# Patient Record
Sex: Female | Born: 1975 | Race: Black or African American | Hispanic: No | Marital: Single | Smoking: Never smoker
Health system: Southern US, Community
[De-identification: ages and names within clinical notes are randomized; demographics above are authoritative.]

## PROBLEM LIST (undated history)

## (undated) HISTORY — PX: LAPAROSCOPIC GASTRIC SLEEVE RESECTION: SHX5895

---

## 2014-07-29 ENCOUNTER — Ambulatory Visit (INDEPENDENT_AMBULATORY_CARE_PROVIDER_SITE_OTHER): Payer: BC Managed Care – PPO | Admitting: Family Medicine

## 2014-07-29 VITALS — BP 120/86 | HR 82 | Temp 97.4°F | Resp 16 | Ht 68.0 in | Wt 187.5 lb

## 2014-07-29 DIAGNOSIS — T23001A Burn of unspecified degree of right hand, unspecified site, initial encounter: Secondary | ICD-10-CM

## 2014-07-29 DIAGNOSIS — IMO0002 Reserved for concepts with insufficient information to code with codable children: Secondary | ICD-10-CM

## 2014-07-29 DIAGNOSIS — Z23 Encounter for immunization: Secondary | ICD-10-CM

## 2014-07-29 MED ORDER — SILVER SULFADIAZINE 1 % EX CREA: 1.0000 "application " | TOPICAL_CREAM | Freq: Every day | CUTANEOUS | Status: DC

## 2014-07-29 NOTE — Progress Notes (Signed)
This chart was scribed for Jillian Sidle, MD by Luisa Dago, ED Scribe. This patient was seen in room 9 and the patient's care was started at 8:16 PM.  Patient ID: Jillian [name] MRN: [id number], DOB: 02/18/1976, 39 y.o. Date of Encounter: 07/29/2014, 8:14 PM  Primary Physician: No PCP Per Patient  Chief Complaint:  Chief Complaint  Patient presents with   Burn    Grease Burn on Right Hand/Arm x 1 week ago    HPI: 39 y.o. 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 Pt states that she was cooking hamburgers when the grease burned her right hand.She is complaining of associated burning and "throbbing" pain. Last date of tetanus vaccine unknown. Pt denies fever, neck pain, sore throat, visual disturbance, CP, cough, SOB, abdominal pain, nausea, emesis, diarrhea, urinary symptoms, back pain, HA, weakness, numbness and rash as associated symptoms.     History reviewed. No pertinent past medical history.   Home Meds: Prior to Admission medications   Not on File    Allergies: No Known Allergies  History   Social History   Marital Status: Single    Spouse Name: N/A   Number of Children: N/A   Years of Education: N/A   Occupational History   Not on file.   Social History Main Topics   Smoking status: Never Smoker    Smokeless tobacco: Never Used   Alcohol Use: No   Drug Use: No   Sexual Activity: Not on file   Other Topics Concern   Not on file   Social History Narrative   No narrative on file     Review of Systems: positive burn to right hand Constitutional: negative for chills, fever, night sweats, weight changes, or fatigue  HEENT: negative for vision changes, hearing loss, congestion, rhinorrhea, ST, epistaxis, or sinus pressure Cardiovascular: negative for chest pain or palpitations Respiratory: negative for  hemoptysis, wheezing, shortness of breath, or cough Abdominal: negative for abdominal pain, nausea, vomiting, diarrhea, or constipation Dermatological: negative for rash Neurologic: negative for headache, dizziness, or syncope All other systems reviewed and are otherwise negative with the exception to those above and in the HPI.   Physical Exam:  Blood pressure 120/86, pulse 82, temperature 97.4 F (36.3 C), temperature source Oral, resp. rate 16, height  (1.727 m), weight 187 lb 8 oz (85.049 kg), SpO2 100 %., Body mass index is 28.52 kg/(m^2). General: Well developed, well nourished, in no acute distress. Head: Normocephalic, atraumatic, eyes without discharge, sclera non-icteric, nares are without discharge. Bilateral auditory canals clear, TM's are without perforation, pearly grey and translucent with reflective cone of light bilaterally. Oral cavity moist, posterior pharynx without exudate, erythema, peritonsillar abscess, or post nasal drip.  Neck: Supple. No thyromegaly. Full ROM. No lymphadenopathy. Lungs: Clear bilaterally to auscultation without wheezes, rales, or rhonchi. Breathing is unlabored. Heart: RRR with S1 S2. No murmurs, rubs, or gallops appreciated. Abdomen: Soft, non-tender, non-distended with normoactive bowel sounds. No hepatomegaly. No rebound/guarding. No obvious abdominal masses. Msk:  Strength and tone normal for age. Extremities/Skin: Warm and dry. No clubbing or cyanosis. No edema. No rashes.Several burns to dorsal right forearm and hand with good re-epythiliazation Neuro: Alert and oriented X 3. Moves all extremities spontaneously. Gait is normal. CNII-XII grossly in tact. Psych:  Responds to questions appropriately with a normal affect.  ASSESSMENT AND PLAN:  39 y.o. year old female with  This chart was scribed in my presence and reviewed by me personally.    ICD-9-CM ICD-10-CM   1. Burn, second degree 949.2 T30.0 silver sulfADIAZINE (SILVADENE) 1 %  cream     Td vaccine greater than or equal to 7yo preservative free IM     CANCELED: DT Vaccine greater than 7yo IM     Signed, Jillian SidleKurt Lauenstein, MD    Signed, Jillian SidleKurt Lauenstein, MD 07/29/2014 8:14 PM

## 2014-07-29 NOTE — Patient Instructions (Addendum)
Burn Care Your skin is a natural barrier to infection. It is the largest organ of your body. Burns damage this natural protection. To help prevent infection, it is very important to follow your caregiver's instructions in the care of your burn. Burns are classified as:  First degree. There is only redness of the skin (erythema). No scarring is expected.  Second degree. There is blistering of the skin. Scarring may occur with deeper burns.  Third degree. All layers of the skin are injured, and scarring is expected. HOME CARE INSTRUCTIONS   Wash your hands well before changing your bandage.  Change your bandage as often as directed by your caregiver.  Remove the old bandage. If the bandage sticks, you may soak it off with cool, clean water.  Cleanse the burn thoroughly but gently with mild soap and water.  Pat the area dry with a clean, dry cloth.  Apply a thin layer of antibacterial cream to the burn.  Apply a clean bandage as instructed by your caregiver.  Keep the bandage as clean and dry as possible.  Elevate the affected area for the first 24 hours, then as instructed by your caregiver.  Only take over-the-counter or prescription medicines for pain, discomfort, or fever as directed by your caregiver. SEEK IMMEDIATE MEDICAL CARE IF:   You develop excessive pain.  You develop redness, tenderness, swelling, or red streaks near the burn.  The burned area develops yellowish-white fluid (pus) or a bad smell.  You have a fever. MAKE SURE YOU:   Understand these instructions.  Will watch your condition.  Will get help right away if you are not doing well or get worse. Document Released: 05/17/2005 Document Revised: 08/09/2011 Document Reviewed: 10/07/2010 [facility] Patient Information 2015 [place], [place]. This information is not intended to replace advice given to you by your health care provider. Make sure you discuss any questions you have with your health care  provider. Diphtheria Toxoid; Tetanus Toxoid Adsorbed, DT, Td What is this medicine? DIPHTHERIA AND TETANUS TOXOIDS ADSORBED (dif THEER ee uh and TET n [place] TOK soids ad SAWRB) is a vaccine. It is used to prevent infections of diphtheria and tetanus (lockjaw). This medicine may be used for other purposes; ask your health care provider or pharmacist if you have questions. COMMON BRAND NAME(S): DECAVAC, TENIVAC What should I tell my health care provider before I take this medicine? They need to know if you have any of these conditions: -bleeding disorder -immune system problems -infection with fever -low levels of platelets in the blood -an unusual or allergic reaction to diphtheria or tetanus toxoid, latex, thimerosal, other medicines, foods, dyes, or preservatives -pregnant or trying to get pregnant -breast-feeding How should I use this medicine? This vaccine is for injection into a muscle. It is given by a health care professional. A copy of Vaccine Information Statements will be given before each vaccination. Read this sheet carefully each time. The sheet may change frequently. Talk to your pediatrician regarding the use of this medicine in children. While this drug may be prescribed for selected conditions, precautions do apply. Overdosage: If you think you have taken too much of this medicine contact a poison control center or emergency room at once. NOTE: This medicine is only for you. Do not share this medicine with others. What if I miss a dose? Keep appointments for follow-up (booster) doses as directed. It is important not to miss your dose. Call your doctor or health care professional if you are unable to  keep an appointment. What may interact with this medicine? -adalimumab -anakinra -infliximab -live vaccines -medicines that suppress your immune system -medicines to treat cancer -medicines that treat or prevent blood clots like daily aspirin, enoxaparin, heparin, ticlopidine,  warfarin -radiopharmaceuticals like iodine I-125 or I-131 This list may not describe all possible interactions. Give your health care provider a list of all the medicines, herbs, non-prescription drugs, or dietary supplements you use. Also tell them if you smoke, drink alcohol, or use illegal drugs. Some items may interact with your medicine. What should I watch for while using this medicine? Contact your doctor or health care professional and seek emergency medical care if any serious side effects occur. This vaccine, like all vaccines, may not fully protect everyone. What side effects may I notice from receiving this medicine? Side effects that you should report to your doctor or health care professional as soon as possible: -allergic reactions like skin rash, itching or hives, swelling of the face, lips, or tongue -arthritis pain -breathing problems -changes in hearing -extreme changes in behavior -fast, irregular heartbeat -fever over 100 degrees F -pain, tingling, numbness in the hands or feet -seizures -unusually weak or tired Side effects that usually do not require medical attention (report to your doctor or health care professional if they continue or are bothersome): -aches or pains -bruising, pain, swelling at site where injected -headache -loss of appetite -low-grade fever of 100 degrees F or less -nausea, vomiting -sleepy -swollen glands This list may not describe all possible side effects. Call your doctor for medical advice about side effects. You may report side effects to FDA at 1-800-FDA-1088. Where should I keep my medicine? This drug is given in a hospital or clinic and will not be stored at home. NOTE: This sheet is a summary. It may not cover all possible information. If you have questions about this medicine, talk to your doctor, pharmacist, or health care provider.  2015, Elsevier/Gold Standard. (2007-09-14 13:57:36)

## 2015-01-02 ENCOUNTER — Ambulatory Visit (INDEPENDENT_AMBULATORY_CARE_PROVIDER_SITE_OTHER): Payer: BC Managed Care – PPO | Admitting: Family Medicine

## 2015-01-02 VITALS — BP 126/82 | HR 79 | Temp 97.9°F | Resp 20 | Ht 68.0 in | Wt 185.1 lb

## 2015-01-02 DIAGNOSIS — R110 Nausea: Secondary | ICD-10-CM | POA: Diagnosis not present

## 2015-01-02 DIAGNOSIS — F411 Generalized anxiety disorder: Secondary | ICD-10-CM | POA: Diagnosis not present

## 2015-01-02 DIAGNOSIS — R197 Diarrhea, unspecified: Secondary | ICD-10-CM

## 2015-01-02 DIAGNOSIS — K219 Gastro-esophageal reflux disease without esophagitis: Secondary | ICD-10-CM | POA: Diagnosis not present

## 2015-01-02 DIAGNOSIS — K522 Allergic and dietetic gastroenteritis and colitis: Secondary | ICD-10-CM | POA: Diagnosis not present

## 2015-01-02 DIAGNOSIS — K5229 Other allergic and dietetic gastroenteritis and colitis: Secondary | ICD-10-CM

## 2015-01-02 LAB — POCT CBC
Granulocyte percent: 39.6 %G (ref 37–80)
HCT, POC: 45.5 % (ref 37.7–47.9)
Hemoglobin: 14.7 g/dL (ref 12.2–16.2)
Lymph, poc: 4 — AB (ref 0.6–3.4)
MCH, POC: 28.3 pg (ref 27–31.2)
MCHC: 32.3 g/dL (ref 31.8–35.4)
MCV: 87.6 fL (ref 80–97)
MID (cbc): 0.4 (ref 0–0.9)
MPV: 8.2 fL (ref 0–99.8)
POC Granulocyte: 2.9 (ref 2–6.9)
POC LYMPH PERCENT: 54.4 % — AB (ref 10–50)
POC MID %: 6 % (ref 0–12)
Platelet Count, POC: 272 10*3/uL (ref 142–424)
RBC: 5.19 M/uL (ref 4.04–5.48)
RDW, POC: 13.6 %
WBC: 7.4 10*3/uL (ref 4.6–10.2)

## 2015-01-02 LAB — GLUCOSE, POCT (MANUAL RESULT ENTRY): POC Glucose: 91 mg/dl (ref 70–99)

## 2015-01-02 MED ORDER — ONDANSETRON 4 MG PO TBDP: 4.0000 mg | ORAL_TABLET | Freq: Three times a day (TID) | ORAL | Status: DC | PRN

## 2015-01-02 MED ORDER — ONDANSETRON 4 MG PO TBDP
4.0000 mg | ORAL_TABLET | Freq: Once | ORAL | Status: AC
  Administered 2015-01-02: 4 mg via ORAL

## 2015-01-02 MED ORDER — CLONAZEPAM 0.5 MG PO TABS: ORAL_TABLET | ORAL | Status: DC

## 2015-01-02 MED ORDER — PANTOPRAZOLE SODIUM 40 MG PO TBEC: 40.0000 mg | DELAYED_RELEASE_TABLET | Freq: Every day | ORAL | Status: DC

## 2015-01-02 MED ORDER — ESCITALOPRAM OXALATE 10 MG PO TABS: 10.0000 mg | ORAL_TABLET | Freq: Every day | ORAL | Status: DC

## 2015-01-02 NOTE — Progress Notes (Signed)
Chief Complaint:  Chief Complaint  Patient presents with  . Abdominal Pain    stomach virus that started on sunday--feeling queasy and nausea.  Marland Kitchen Anxiety    feels like her anxiety has been getting worse x 2 wks  . Gastrophageal Reflux    reflux daily since she had the gastric surgery    HPI: Jillian [name] is a 39 y.o. female who reports to [facility] today complaining of feels sick on her stomach since Sunday, feel some throat irritation on Sunday after she ate watermelon, she denies any SOB or CP.  She had nonbloody diarrhea. She feesl subjectively warm and had chills. She had nausea . She has acid reflux daily and she was taking zantac without relief. She still has it every day, this started 4 days ago , she had diarrhea Sunday and Monday but not anymore. 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 No family hx of inflammaotry disease, IBD, crohns, UC. Her dausghter was slightly nauseated after eating the watermelon, Her neighbor had a stomach virus. Has not eaten much because felt sick. She does nto usually have these sxs with her cycle.   She has had anxiety for most of her life, mom and sister take celexa and lexapro . SHe has tried wellbutrin without releif in the past. She has anxiety about everything. She is currently on her menstrual cycle.Denies any SI/Hi/hallucinations. She is very stressed at work and everything makes her anxious, decrease sleep due to this. Then it makes her reflux worse.   History reviewed. No pertinent past medical history. Past Surgical History  Procedure Laterality Date  . Cesarean section    . Laparoscopic gastric sleeve resection     History   Social History  . Marital Status: Single    Spouse Name: N/A  . Number of Children: N/A  . Years of Education: N/A   Social History Main Topics  . Smoking status: Never Smoker   . Smokeless tobacco: Never Used  . Alcohol Use: No  . Drug Use: No  . Sexual Activity: Not on file    Other Topics Concern  . None   Social History Narrative   Family History  Problem Relation Age of Onset  . Diabetes Mother   . Hyperlipidemia Mother   . Hypertension Mother   . Hyperlipidemia Sister   . Diabetes Maternal Grandfather   . Heart disease Paternal Grandmother    No Known Allergies Prior to Admission medications   Medication Sig Start Date End Date Taking? Authorizing Provider  silver sulfADIAZINE (SILVADENE) 1 % cream Apply 1 application topically daily. Patient not taking: Reported on 01/02/2015 07/29/14   Elvina Sidle, MD     ROS: The patient denies night sweats, unintentional weight loss, chest pain, palpitations, wheezing, dyspnea on exertion,  vomiting, dysuria, hematuria, melena, numbness, weakness, or tingling.   All other systems have been reviewed and were otherwise negative with the exception of those mentioned in the HPI and as above.    PHYSICAL EXAM: Filed Vitals:   01/02/15 1906  BP: 126/82  Pulse: 79  Temp: 97.9 F (36.6 C)  Resp: 20   Body mass index is 28.15 kg/(m^2).   General: Alert, no acute distress, anxious HEENT:  Normocephalic, atraumatic, oropharynx patent. EOMI, PERRLA,  TM normal, oral mucosa dry Cardiovascular:  Regular rate and rhythm, no rubs murmurs or gallops.  No Carotid bruits, radial pulse intact. No pedal edema.  Respiratory: Clear to auscultation bilaterally.  No wheezes, rales, or rhonchi.  No cyanosis, no use of accessory musculature Abdominal: No organomegaly, abdomen is soft and non-tender, positive bowel sounds. No masses. Skin: No rashes. Neurologic: Facial musculature symmetric. Psychiatric: Patient acts appropriately throughout our interaction. Lymphatic: No cervical or submandibular lymphadenopathy Musculoskeletal: Gait intact. No edema, tenderness   LABS: Results for orders placed or performed in visit on 01/02/15  POCT CBC  Result Value Ref Range   WBC 7.4 4.6 - 10.2 K/uL   Lymph, poc 4.0 (A) 0.6 -  3.4   POC LYMPH PERCENT 54.4 (A) 10 - 50 %L   MID (cbc) 0.4 0 - 0.9   POC MID % 6.0 0 - 12 %M   POC Granulocyte 2.9 2 - 6.9   Granulocyte percent 39.6 37 - 80 %G   RBC 5.19 4.04 - 5.48 M/uL   Hemoglobin 14.7 12.2 - 16.2 g/dL   HCT, POC 16.1 09.6 - 47.9 %   MCV 87.6 80 - 97 fL   MCH, POC 28.3 27 - 31.2 pg   MCHC 32.3 31.8 - 35.4 g/dL   RDW, POC 04.5 %   Platelet Count, POC 272 142 - 424 K/uL   MPV 8.2 0 - 99.8 fL  POCT glucose (manual entry)  Result Value Ref Range   POC Glucose 91 70 - 99 mg/dl     EKG/XRAY:   Primary read interpreted by Dr. Conley Rolls at [facility]   ASSESSMENT/PLAN: Encounter Diagnoses  Name Primary?  . Dietetic gastroenteritis Yes  . Nausea without vomiting   . Generalized anxiety disorder   . Diarrhea   . Gastroesophageal reflux disease, esophagitis presence not specified    IVF x 1, felt better Rx Zofran Rx Klonopin prn, this is too bridge until the Lexapro becomes effective at 4-6 weeks RX Lexapro, protonix BRAT diet Fu in 6-8 weeks  Gross sideeffects, risk and benefits, and alternatives of medications d/w patient. Patient is aware that all medications have potential sideeffects and we are unable to predict every sideeffect or drug-drug interaction that may occur.  Jillian Hobbs DO  01/02/2015 8:51 PM

## 2015-01-06 DIAGNOSIS — F411 Generalized anxiety disorder: Secondary | ICD-10-CM | POA: Insufficient documentation

## 2015-01-06 DIAGNOSIS — K219 Gastro-esophageal reflux disease without esophagitis: Secondary | ICD-10-CM | POA: Insufficient documentation

## 2015-02-06 ENCOUNTER — Other Ambulatory Visit (INDEPENDENT_AMBULATORY_CARE_PROVIDER_SITE_OTHER): Payer: Self-pay | Admitting: Physician Assistant

## 2015-02-06 DIAGNOSIS — R1013 Epigastric pain: Secondary | ICD-10-CM

## 2015-02-07 ENCOUNTER — Ambulatory Visit
Admission: RE | Admit: 2015-02-07 | Discharge: 2015-02-07 | Disposition: A | Discharge status: Home / Self Care | Payer: BC Managed Care – PPO | Source: Ambulatory Visit | Attending: Physician Assistant | Admitting: Physician Assistant

## 2015-02-07 DIAGNOSIS — R1013 Epigastric pain: Secondary | ICD-10-CM

## 2015-02-17 ENCOUNTER — Ambulatory Visit (INDEPENDENT_AMBULATORY_CARE_PROVIDER_SITE_OTHER): Payer: BC Managed Care – PPO | Admitting: Family Medicine

## 2015-02-17 VITALS — BP 144/90 | HR 87 | Temp 98.1°F | Resp 16 | Ht 68.0 in | Wt 182.4 lb

## 2015-02-17 DIAGNOSIS — J069 Acute upper respiratory infection, unspecified: Secondary | ICD-10-CM

## 2015-02-17 DIAGNOSIS — Z113 Encounter for screening for infections with a predominantly sexual mode of transmission: Secondary | ICD-10-CM | POA: Diagnosis not present

## 2015-02-17 NOTE — Patient Instructions (Signed)
It appears that you have a cold or allergies Use OTC medications as needed and let me know if you are not better in the next few days- Sooner if worse.   I will be in touch with your labs asap- if you sign up for mychart I can send them to you as soon as they come in Take care!

## 2015-02-17 NOTE — Progress Notes (Signed)
[facility] [address], [place] [address] [phone]  Date:  02/17/2015   Name:  Jillian [name]   DOB:  1976-03-02   MRN:  [id number]  PCP:  No PCP Per Patient    Chief Complaint: Exposure to STD   History of Present Illness:  Jillian [name] is a 39 y.o. very pleasant female patient who presents with the following:  She has noted a scratchy throat and headache for a couple of days.  No fever Thinks it is a cold but thought she would mention it.   She is generally in good health  She has not had any STI sx or known exposures.  She was just worried and wanted to be tested today  She has not urinanated in the last hour.    Patient Active Problem List   Diagnosis Date Noted  . Generalized anxiety disorder 01/06/2015  . Esophageal reflux 01/06/2015    No past medical history on file.  Past Surgical History  Procedure Laterality Date  . Cesarean section    . Laparoscopic gastric sleeve resection      Social History  Substance Use Topics  . Smoking status: Never Smoker   . Smokeless tobacco: Never Used  . Alcohol Use: No    Family History  Problem Relation Age of Onset  . Diabetes Mother   . Hyperlipidemia Mother   . Hypertension Mother   . Hyperlipidemia Sister   . Diabetes Maternal Grandfather   . Heart disease Paternal Grandmother     No Known Allergies  Medication list has been reviewed and updated.  Current Outpatient Prescriptions on File Prior to Visit  Medication Sig Dispense Refill  . escitalopram (LEXAPRO) 10 MG tablet Take 1 tablet (10 mg total) by mouth daily. 30 tablet 1  . pantoprazole (PROTONIX) 40 MG tablet Take 1 tablet (40 mg total) by mouth daily. 30 tablet 3   No current facility-administered medications on file prior to visit.    Review of Systems:  As per HPI- otherwise negative.   Physical Examination: Filed Vitals:   02/17/15 1537  BP: 144/90  Pulse: 87  Temp: 98.1 F (36.7 C)  Resp: 16    Filed Vitals:   02/17/15 1537  Height:  (1.727 m)  Weight: 182 lb 6.4 oz (82.736 kg)   Body mass index is 27.74 kg/(m^2). Ideal Body Weight: Weight in (lb) to have BMI = 25: 164.1  GEN: WDWN, NAD, Non-toxic, A & O x 3, looks well HEENT: Atraumatic, Normocephalic. Neck supple. No masses, No LAD.  Bilateral TM wnl, oropharynx normal.  PEERL,EOMI.   Ears and Nose: No external deformity. CV: RRR, No M/G/R. No JVD. No thrill. No extra heart sounds. PULM: CTA B, no wheezes, crackles, rhonchi. No retractions. No resp. distress. No accessory muscle use. EXTR: No c/c/e NEURO Normal gait.  PSYCH: Normally interactive. Conversant. Not depressed or anxious appearing.  Calm demeanor.    Assessment and Plan: Screening for STD (sexually transmitted disease) - Plan: GC/Chlamydia Probe Amp, HIV antibody, Hepatitis B surface antibody, Hepatitis B surface antigen, Hepatitis C antibody, RPR  Viral URI  Reassured that she seems to have a viral URI- let me know if not better soon STI testing today  Signed Abbe Amsterdam, MD

## 2015-02-18 LAB — HEPATITIS C ANTIBODY: HCV AB: NEGATIVE

## 2015-02-18 LAB — GC/CHLAMYDIA PROBE AMP
CT Probe RNA: NEGATIVE
GC Probe RNA: NEGATIVE

## 2015-02-18 LAB — HEPATITIS B SURFACE ANTIBODY, QUANTITATIVE: Hepatitis B-Post: 0 m[IU]/mL

## 2015-02-18 LAB — HIV ANTIBODY (ROUTINE TESTING W REFLEX): HIV 1&2 Ab, 4th Generation: NONREACTIVE

## 2015-02-18 LAB — HEPATITIS B SURFACE ANTIGEN: Hepatitis B Surface Ag: NEGATIVE

## 2015-02-18 LAB — RPR

## 2015-03-23 ENCOUNTER — Emergency Department (HOSPITAL_COMMUNITY)
Admission: EM | Admit: 2015-03-23 | Discharge: 2015-03-23 | Disposition: A | Discharge status: Home / Self Care | Payer: BC Managed Care – PPO | Attending: Emergency Medicine | Admitting: Emergency Medicine

## 2015-03-23 ENCOUNTER — Encounter (HOSPITAL_COMMUNITY): Payer: Self-pay | Admitting: Oncology

## 2015-03-23 ENCOUNTER — Emergency Department (HOSPITAL_COMMUNITY): Payer: BC Managed Care – PPO

## 2015-03-23 DIAGNOSIS — W19XXXA Unspecified fall, initial encounter: Secondary | ICD-10-CM

## 2015-03-23 DIAGNOSIS — Y9389 Activity, other specified: Secondary | ICD-10-CM | POA: Diagnosis not present

## 2015-03-23 DIAGNOSIS — W01198A Fall on same level from slipping, tripping and stumbling with subsequent striking against other object, initial encounter: Secondary | ICD-10-CM | POA: Diagnosis not present

## 2015-03-23 DIAGNOSIS — S29009A Unspecified injury of muscle and tendon of unspecified wall of thorax, initial encounter: Secondary | ICD-10-CM | POA: Diagnosis not present

## 2015-03-23 DIAGNOSIS — Y998 Other external cause status: Secondary | ICD-10-CM | POA: Insufficient documentation

## 2015-03-23 DIAGNOSIS — Y9289 Other specified places as the place of occurrence of the external cause: Secondary | ICD-10-CM | POA: Insufficient documentation

## 2015-03-23 DIAGNOSIS — Z79899 Other long term (current) drug therapy: Secondary | ICD-10-CM | POA: Diagnosis not present

## 2015-03-23 DIAGNOSIS — M542 Cervicalgia: Secondary | ICD-10-CM

## 2015-03-23 DIAGNOSIS — S199XXA Unspecified injury of neck, initial encounter: Secondary | ICD-10-CM | POA: Insufficient documentation

## 2015-03-23 MED ORDER — ONDANSETRON 8 MG PO TBDP
8.0000 mg | ORAL_TABLET | Freq: Once | ORAL | Status: AC
  Administered 2015-03-23: 8 mg via ORAL
  Filled 2015-03-23: qty 1

## 2015-03-23 MED ORDER — NAPROXEN 500 MG PO TABS: 500.0000 mg | ORAL_TABLET | Freq: Two times a day (BID) | ORAL | Status: DC

## 2015-03-23 MED ORDER — KETOROLAC TROMETHAMINE 60 MG/2ML IM SOLN
60.0000 mg | Freq: Once | INTRAMUSCULAR | Status: AC
  Administered 2015-03-23: 60 mg via INTRAMUSCULAR
  Filled 2015-03-23: qty 2

## 2015-03-23 MED ORDER — METHOCARBAMOL 500 MG PO TABS: 500.0000 mg | ORAL_TABLET | Freq: Two times a day (BID) | ORAL | Status: DC

## 2015-03-23 MED ORDER — OXYCODONE-ACETAMINOPHEN 5-325 MG PO TABS: 1.0000 | ORAL_TABLET | Freq: Four times a day (QID) | ORAL | Status: DC | PRN

## 2015-03-23 MED ORDER — HYDROMORPHONE HCL 1 MG/ML IJ SOLN
1.0000 mg | Freq: Once | INTRAMUSCULAR | Status: AC
  Administered 2015-03-23: 1 mg via INTRAMUSCULAR
  Filled 2015-03-23: qty 1

## 2015-03-23 NOTE — ED Provider Notes (Signed)
CSN: [id number]     Arrival date & time 03/23/15  0004 History   First MD Initiated Contact with Patient 03/23/15 0016     Chief Complaint  Patient presents with  . Neck Pain     (Consider location/radiation/quality/duration/timing/severity/associated sxs/prior Treatment) HPI Comments: 39 year old female presents to the emergency department for further evaluation of neck pain. Patient states that she was playing around on a bed when she fell off backwards, hitting her head and neck. Patient denies any loss of consciousness. She is complaining of neck pain which is radiating down her bilateral arms. Pain is worse with movement of her neck. No medications taken prior to arrival. Patient has had no extremity numbness or weakness. No nausea or vomiting. Patient denies a history of head or neck injury. She has had no difficulty ambulating since the event.  Patient is a 39 y.o. female presenting with neck pain. The history is provided by the patient. No language interpreter was used.  Neck Pain Associated symptoms: no weakness     History reviewed. No pertinent past medical history. Past Surgical History  Procedure Laterality Date  . Cesarean section    . Laparoscopic gastric sleeve resection     Family History  Problem Relation Age of Onset  . Diabetes Mother   . Hyperlipidemia Mother   . Hypertension Mother   . Hyperlipidemia Sister   . Diabetes Maternal Grandfather   . Heart disease Paternal Grandmother    Social History  Substance Use Topics  . Smoking status: Never Smoker   . Smokeless tobacco: Never Used  . Alcohol Use: 0.0 oz/week    0 Standard drinks or equivalent per week     Comment: socially   OB History    No data available      Review of Systems  Gastrointestinal: Negative for vomiting.  Musculoskeletal: Positive for back pain and neck pain.  Neurological: Negative for syncope and weakness.  All other systems reviewed and are negative.   Allergies  Review  of patient's allergies indicates no known allergies.  Home Medications   Prior to Admission medications   Medication Sig Start Date End Date Taking? Authorizing Provider  escitalopram (LEXAPRO) 10 MG tablet Take 1 tablet (10 mg total) by mouth daily. 01/02/15   Thao P Le, DO  pantoprazole (PROTONIX) 40 MG tablet Take 1 tablet (40 mg total) by mouth daily. 01/02/15   Thao P Le, DO   BP 138/92 mmHg  Pulse 95  Temp(Src) 97.9 F (36.6 C) (Oral)  Resp 20  Ht  (1.727 m)  Wt 185 lb (83.915 kg)  BMI 28.14 kg/m2  SpO2 100%  LMP 03/08/2015 (Approximate)   Physical Exam  Constitutional: She is oriented to person, place, and time. She appears well-developed and well-nourished. No distress.  Nontoxic/nonseptic appearing  HENT:  Head: Normocephalic and atraumatic.  Eyes: Conjunctivae and EOM are normal. No scleral icterus.  Neck: Normal range of motion.  Normal range of motion exhibited. Tenderness to palpation of the cervical midline without bony deformities, step-offs, or crepitus.  Cardiovascular: Normal rate, regular rhythm and intact distal pulses.   Distal radial pulse 2+ bilaterally  Pulmonary/Chest: Effort normal. No respiratory distress.  Musculoskeletal: Normal range of motion. She exhibits tenderness.  Mild tenderness to the thoracic midline without bony deformities, step-offs, or crepitus. No significant paraspinal tenderness.  Neurological: She is alert and oriented to person, place, and time. She exhibits normal muscle tone. Coordination normal.  Sensation to light touch intact. Grip strength  5/5 bilaterally. Strength against resistance 5/5 in all major muscle groups of bilateral upper extremities. Patient ambulatory with steady gait in the emergency department  Skin: Skin is warm and dry. No rash noted. She is not diaphoretic. No erythema. No pallor.  Psychiatric: She has a normal mood and affect. Her behavior is normal.  Nursing note and vitals reviewed.   ED Course   Procedures (including critical care time) Labs Review Labs Reviewed - No data to display  Imaging Review Dg Thoracic Spine 2 View  03/23/2015  CLINICAL DATA:  Fall out of bed this morning, now with thoracic back pain. EXAM: THORACIC SPINE 2 VIEWS COMPARISON:  None. FINDINGS: The alignment is maintained. Vertebral body heights are maintained. No significant disc space narrowing. Posterior elements appear intact. There is endplate spurring at T11-T12. There is no paravertebral soft tissue abnormality. IMPRESSION: No fracture or subluxation of the thoracic spine. Electronically Signed   By: Rubye OaksMelanie  Ehinger M.D.   On: 03/23/2015 01:15   Ct Cervical Spine Wo Contrast  03/23/2015  CLINICAL DATA:  Bilateral arm and neck pain after falling from bed. Initial encounter. EXAM: CT CERVICAL SPINE WITHOUT CONTRAST TECHNIQUE: Multidetector CT imaging of the cervical spine was performed without intravenous contrast. Multiplanar CT image reconstructions were also generated. COMPARISON:  None. FINDINGS: There is no evidence of fracture or subluxation. Vertebral bodies demonstrate normal height and alignment. Intervertebral disc spaces are preserved. Prevertebral soft tissues are within normal limits. The visualized neural foramina are grossly unremarkable. There is incomplete fusion of the posterior arch of C1. The thyroid gland is unremarkable in appearance. The visualized lung apices are clear. No significant soft tissue abnormalities are seen. IMPRESSION: No evidence of fracture or subluxation along the cervical spine. Electronically Signed   By: Roanna RaiderJeffery  Chang M.D.   On: 03/23/2015 01:09     I have personally reviewed and evaluated these images and lab results as part of my medical decision-making.   EKG Interpretation None      MDM   Final diagnoses:  Posterior neck pain  Fall, initial encounter    39 year old female presents to the emergency department for further evaluation of neck pain after a  fall. Patient has no loss of consciousness. She is neurovascularly intact. Sensation and strength in the bilateral upper extremities is intact. CT and x-ray negative for fracture or subluxation. Pain controlled in ED with Toradol and Dilaudid. No indication for further emergent workup. Will discharge with supportive care instructions. Return precautions given at discharge. Patient discharged in good condition with no unaddressed concerns.   Filed Vitals:   03/23/15 0012  BP: 138/92  Pulse: 95  Temp: 97.9 F (36.6 C)  TempSrc: Oral  Resp: 20  Height: 5\' 8"  (1.727 m)  Weight: 185 lb (83.915 kg)  SpO2: 100%     Antony MaduraKelly Terika Pillard, PA-C 03/23/15 [id number]  Shon Batonourtney F Horton, MD 03/23/15 [phone]

## 2015-03-23 NOTE — ED Notes (Signed)
Pt states she was playing around on the bed and fell off onto her head.  C/o severe neck pain that radiates down both arms.  Pt is ambulatory in triage.  Denies LOC.

## 2015-03-23 NOTE — Discharge Instructions (Signed)
Your CT scan and x-ray show no fracture or dislocation of any of the bones in your neck or upper back. Take naproxen and Robaxin as prescribed for symptoms. You may take Percocet as needed for severe pain. Apply ice to areas of injury 3-4 times per day for 15-20 minutes each time. Follow-up with a primary care doctor for recheck of your symptoms. Return to the emergency department if you have an inability to move your arms or loss of sensation in your arms or any of the symptoms listed below.  Cervical Sprain A cervical sprain is an injury in the neck in which the strong, fibrous tissues (ligaments) that connect your neck bones stretch or tear. Cervical sprains can range from mild to severe. Severe cervical sprains can cause the neck vertebrae to be unstable. This can lead to damage of the spinal cord and can result in serious nervous system problems. The amount of time it takes for a cervical sprain to get better depends on the cause and extent of the injury. Most cervical sprains heal in 1 to 3 weeks. CAUSES  Severe cervical sprains may be caused by:   Contact sport injuries (such as from football, rugby, wrestling, hockey, auto racing, gymnastics, diving, martial arts, or boxing).   Motor vehicle collisions.   Whiplash injuries. This is an injury from a sudden forward and backward whipping movement of the head and neck.  Falls.  Mild cervical sprains may be caused by:   Being in an awkward position, such as while cradling a telephone between your ear and shoulder.   Sitting in a chair that does not offer proper support.   Working at a poorly Marketing executive station.   Looking up or down for long periods of time.  SYMPTOMS   Pain, soreness, stiffness, or a burning sensation in the front, back, or sides of the neck. This discomfort may develop immediately after the injury or slowly, 24 hours or more after the injury.   Pain or tenderness directly in the middle of the back of  the neck.   Shoulder or upper back pain.   Limited ability to move the neck.   Headache.   Dizziness.   Weakness, numbness, or tingling in the hands or arms.   Muscle spasms.   Difficulty swallowing or chewing.   Tenderness and swelling of the neck.  DIAGNOSIS  Most of the time your health care provider can diagnose a cervical sprain by taking your history and doing a physical exam. Your health care provider will ask about previous neck injuries and any known neck problems, such as arthritis in the neck. X-rays may be taken to find out if there are any other problems, such as with the bones of the neck. Other tests, such as a CT scan or MRI, may also be needed.  TREATMENT  Treatment depends on the severity of the cervical sprain. Mild sprains can be treated with rest, keeping the neck in place (immobilization), and pain medicines. Severe cervical sprains are immediately immobilized. Further treatment is done to help with pain, muscle spasms, and other symptoms and may include:  Medicines, such as pain relievers, numbing medicines, or muscle relaxants.   Physical therapy. This may involve stretching exercises, strengthening exercises, and posture training. Exercises and improved posture can help stabilize the neck, strengthen muscles, and help stop symptoms from returning.  HOME CARE INSTRUCTIONS   Put ice on the injured area.   Put ice in a plastic bag.   Place a  towel between your skin and the bag.   Leave the ice on for 15-20 minutes, 3-4 times a day.   If your injury was severe, you may have been given a cervical collar to wear. A cervical collar is a two-piece collar designed to keep your neck from moving while it heals.  Do not remove the collar unless instructed by your health care provider.  If you have long hair, keep it outside of the collar.  Ask your health care provider before making any adjustments to your collar. Minor adjustments may be required  over time to improve comfort and reduce pressure on your chin or on the back of your head.  Ifyou are allowed to remove the collar for cleaning or bathing, follow your health care provider's instructions on how to do so safely.  Keep your collar clean by wiping it with mild soap and water and drying it completely. If the collar you have been given includes removable pads, remove them every 1-2 days and hand wash them with soap and water. Allow them to air dry. They should be completely dry before you wear them in the collar.  If you are allowed to remove the collar for cleaning and bathing, wash and dry the skin of your neck. Check your skin for irritation or sores. If you see any, tell your health care provider.  Do not drive while wearing the collar.   Only take over-the-counter or prescription medicines for pain, discomfort, or fever as directed by your health care provider.   Keep all follow-up appointments as directed by your health care provider.   Keep all physical therapy appointments as directed by your health care provider.   Make any needed adjustments to your workstation to promote good posture.   Avoid positions and activities that make your symptoms worse.   Warm up and stretch before being active to help prevent problems.  SEEK MEDICAL CARE IF:   Your pain is not controlled with medicine.   You are unable to decrease your pain medicine over time as planned.   Your activity level is not improving as expected.  SEEK IMMEDIATE MEDICAL CARE IF:   You develop any bleeding.  You develop stomach upset.  You have signs of an allergic reaction to your medicine.   Your symptoms get worse.   You develop new, unexplained symptoms.   You have numbness, tingling, weakness, or paralysis in any part of your body.  MAKE SURE YOU:   Understand these instructions.  Will watch your condition.  Will get help right away if you are not doing well or get worse.     This information is not intended to replace advice given to you by your health care provider. Make sure you discuss any questions you have with your health care provider.   Document Released: 03/14/2007 Document Revised: 05/22/2013 Document Reviewed: 11/22/2012 Elsevier Interactive Patient Education [employer]2016 Elsevier Inc.

## 2015-04-10 ENCOUNTER — Other Ambulatory Visit: Payer: Self-pay | Admitting: Family Medicine

## 2015-05-04 ENCOUNTER — Other Ambulatory Visit: Payer: Self-pay | Admitting: Family Medicine

## 2015-05-08 ENCOUNTER — Telehealth: Payer: Self-pay

## 2015-05-08 NOTE — Telephone Encounter (Signed)
PA completed for pantoprazole on covermymeds. Pending. Pt has daily Sxs of GERD and has failed zantac.

## 2015-05-16 ENCOUNTER — Other Ambulatory Visit: Payer: Self-pay | Admitting: Family Medicine

## 2015-05-16 NOTE — Telephone Encounter (Signed)
PA approved through 05/07/16 .notified pharm.

## 2015-05-19 ENCOUNTER — Ambulatory Visit (INDEPENDENT_AMBULATORY_CARE_PROVIDER_SITE_OTHER): Payer: BC Managed Care – PPO | Admitting: Physician Assistant

## 2015-05-19 VITALS — BP 118/82 | HR 68 | Temp 98.3°F | Resp 16 | Ht 67.0 in | Wt 192.1 lb

## 2015-05-19 DIAGNOSIS — K219 Gastro-esophageal reflux disease without esophagitis: Secondary | ICD-10-CM | POA: Diagnosis not present

## 2015-05-19 DIAGNOSIS — R350 Frequency of micturition: Secondary | ICD-10-CM | POA: Diagnosis not present

## 2015-05-19 DIAGNOSIS — R251 Tremor, unspecified: Secondary | ICD-10-CM | POA: Diagnosis not present

## 2015-05-19 DIAGNOSIS — R42 Dizziness and giddiness: Secondary | ICD-10-CM | POA: Diagnosis not present

## 2015-05-19 DIAGNOSIS — R631 Polydipsia: Secondary | ICD-10-CM

## 2015-05-19 LAB — POCT CBC
GRANULOCYTE PERCENT: 51.3 % (ref 37–80)
HCT, POC: 42.4 % (ref 37.7–47.9)
HEMOGLOBIN: 14.6 g/dL (ref 12.2–16.2)
Lymph, poc: 3.8 — AB (ref 0.6–3.4)
MCH: 29.5 pg (ref 27–31.2)
MCHC: 34.4 g/dL (ref 31.8–35.4)
MCV: 85.7 fL (ref 80–97)
MID (CBC): 0.1 (ref 0–0.9)
MPV: 8.1 fL (ref 0–99.8)
PLATELET COUNT, POC: 245 10*3/uL (ref 142–424)
POC Granulocyte: 4.1 (ref 2–6.9)
POC LYMPH PERCENT: 47.6 %L (ref 10–50)
POC MID %: 1.1 % (ref 0–12)
RBC: 4.95 M/uL (ref 4.04–5.48)
RDW, POC: 13.2 %
WBC: 7.9 10*3/uL (ref 4.6–10.2)

## 2015-05-19 LAB — POCT URINALYSIS DIP (MANUAL ENTRY)
BILIRUBIN UA: NEGATIVE
BILIRUBIN UA: NEGATIVE
Blood, UA: NEGATIVE
Glucose, UA: NEGATIVE
LEUKOCYTES UA: NEGATIVE
Nitrite, UA: NEGATIVE
Protein Ur, POC: NEGATIVE
SPEC GRAV UA: 1.025
Urobilinogen, UA: 2
pH, UA: 6

## 2015-05-19 LAB — POCT GLYCOSYLATED HEMOGLOBIN (HGB A1C): Hemoglobin A1C: 4.9

## 2015-05-19 LAB — GLUCOSE, POCT (MANUAL RESULT ENTRY): POC Glucose: 88 mg/dl (ref 70–99)

## 2015-05-19 MED ORDER — RANITIDINE HCL 150 MG PO TABS: 150.0000 mg | ORAL_TABLET | Freq: Two times a day (BID) | ORAL | Status: DC

## 2015-05-19 NOTE — Patient Instructions (Signed)
I would like you to start taking the ranitidine.   I placed the referral to [facility], so await there call. I will be back in touch with you with the results in the next 10 days.   I would like to know if you have had no improvement of your symptoms within the next 2 weeks.  Cutting the coffee.

## 2015-05-19 NOTE — Progress Notes (Signed)
[facility] [address], [place] [address] [phone]  Date:  05/19/2015   Name:  Jillian [name]   DOB:  10-04-1975   MRN:  [id number]  PCP:  Abbe Amsterdam, MD    History of Present Illness:  Jillian [name] is a 39 y.o. female patient who presents to [facility] for cc of dizziness, and tremulousness. For 1 month, she feels tremulous with sweats and hot after eating in the morning.  She feels hungry and thirsty.  She eats chocolate and orange juice to relieve her symptoms.  No nausea.  She may feel lightheaded.  Denies vision changes.  No hx of diabetes.  No hx of diarrhea, or blood in stool.   -last month after feeling really shaky sweaty and hot.  Feels hungry.  She will feel shaky and hungry and get orange juice, and chocolate.  No nausea.  Dizzy and lightheaded.  No cision change.  Urinary frequency.  No diarrhea -sexually active.  No pregnancy.     Mother is diabetic, no thyroid disease. Feeling dry and itchy.  No chest pains or palpitats.   --eating: sausage patty and scrambled egg.  Patient reports that she is not following her diet governed by gastroenterologist.  Patient Active Problem List   Diagnosis Date Noted  . Generalized anxiety disorder 01/06/2015  . Esophageal reflux 01/06/2015    History reviewed. No pertinent past medical history.  Past Surgical History  Procedure Laterality Date  . Cesarean section    . Laparoscopic gastric sleeve resection      Social History  Substance Use Topics  . Smoking status: Never Smoker   . Smokeless tobacco: Never Used  . Alcohol Use: 0.0 oz/week    0 Standard drinks or equivalent per week     Comment: socially    Family History  Problem Relation Age of Onset  . Diabetes Mother   . Hyperlipidemia Mother   . Hypertension Mother   . Hyperlipidemia Sister   . Diabetes Maternal Grandfather   . Heart disease Paternal Grandmother     No Known Allergies  Medication list has been reviewed and  updated.  Current Outpatient Prescriptions on File Prior to Visit  Medication Sig Dispense Refill  . escitalopram (LEXAPRO) 10 MG tablet TAKE 1 TABLET (10 MG TOTAL) BY MOUTH DAILY. 30 tablet 1  . pantoprazole (PROTONIX) 40 MG tablet Take 1 tablet (40 mg total) by mouth daily. 30 tablet 3   No current facility-administered medications on file prior to visit.    ROS ROS otherwise unremarkable unless listed above.   Physical Examination: BP 118/82 mmHg  Pulse 68  Temp(Src) 98.3 F (36.8 C) (Oral)  Resp 16  Ht  (1.702 m)  Wt 192 lb 2 oz (87.147 kg)  BMI 30.08 kg/m2  LMP 05/08/2015 (Approximate) Ideal Body Weight: Weight in (lb) to have BMI = 25: 159.3  Physical Exam  Constitutional: She is oriented to person, place, and time. She appears well-developed and well-nourished. No distress.  HENT:  Head: Normocephalic and atraumatic.  Right Ear: External ear normal.  Left Ear: External ear normal.  Eyes: Conjunctivae and EOM are normal. Pupils are equal, round, and reactive to light.  Cardiovascular: Normal rate and regular rhythm.  Exam reveals no gallop and no friction rub.   No murmur heard. Pulmonary/Chest: Effort normal. No respiratory distress. She has no wheezes.  Abdominal: Soft. Bowel sounds are normal. She exhibits no distension and no mass. There is no hepatosplenomegaly. There  is no tenderness. There is no tenderness at McBurney's point and negative Murphy's sign.  Neurological: She is alert and oriented to person, place, and time.  Skin: Skin is warm and dry. She is not diaphoretic.  Psychiatric: She has a normal mood and affect. Her behavior is normal.    Results for orders placed or performed in visit on 05/19/15  POCT CBC  Result Value Ref Range   WBC 7.9 4.6 - 10.2 K/uL   Lymph, poc 3.8 (A) 0.6 - 3.4   POC LYMPH PERCENT 47.6 10 - 50 %L   MID (cbc) 0.1 0 - 0.9   POC MID % 1.1 0 - 12 %M   POC Granulocyte 4.1 2 - 6.9   Granulocyte percent 51.3 37 - 80 %G    RBC 4.95 4.04 - 5.48 M/uL   Hemoglobin 14.6 12.2 - 16.2 g/dL   HCT, POC 81.142.4 91.437.7 - 47.9 %   MCV 85.7 80 - 97 fL   MCH, POC 29.5 27 - 31.2 pg   MCHC 34.4 31.8 - 35.4 g/dL   RDW, POC 78.213.2 %   Platelet Count, POC 245 142 - 424 K/uL   MPV 8.1 0 - 99.8 fL  POCT glucose (manual entry)  Result Value Ref Range   POC Glucose 88 70 - 99 mg/dl  POCT glycosylated hemoglobin (Hb A1C)  Result Value Ref Range   Hemoglobin A1C 4.9   POCT urinalysis dipstick  Result Value Ref Range   Color, UA yellow yellow   Clarity, UA clear clear   Glucose, UA negative negative   Bilirubin, UA negative negative   Ketones, POC UA negative negative   Spec Grav, UA 1.025    Blood, UA negative negative   pH, UA 6.0    Protein Ur, POC negative negative   Urobilinogen, UA 2.0    Nitrite, UA Negative Negative   Leukocytes, UA Negative Negative    [facility].  Assessment and Plan: [name] is a 39 y.o. female who is here today for chief complaint of tremulousness, dizziness, and polydipsia.   Diabetes 2 ruled out at this time.  Non-acute.  I am suggesting that she return to her appropriate diet post-operatively and that she follow up with gastroenterologist.   Checking for depletion with resection.   She will return if her symptoms continue after maintaining her diet.   Dizziness - Plan: POCT CBC, COMPLETE METABOLIC PANEL WITH GFR, Vitamin B12, TSH, EKG 12-Lead, Folate, Ambulatory referral to Gastroenterology  Polydipsia - Plan: POCT glucose (manual entry), POCT glycosylated hemoglobin (Hb A1C), POCT urinalysis dipstick, Folate  Urinary frequency - Plan: POCT glucose (manual entry), POCT glycosylated hemoglobin (Hb A1C), POCT urinalysis dipstick  Tremulousness - Plan: Ambulatory referral to Gastroenterology  Gastroesophageal reflux disease, esophagitis presence not specified - Plan: ranitidine (ZANTAC) 150 MG tablet   Trena PlattStephanie English, PA-C [facility] 05/19/2015 6:55 PM

## 2015-05-20 LAB — COMPLETE METABOLIC PANEL WITH GFR
ALBUMIN: 4.6 g/dL (ref 3.6–5.1)
ALK PHOS: 53 U/L (ref 33–115)
ALT: 14 U/L (ref 6–29)
AST: 17 U/L (ref 10–30)
BILIRUBIN TOTAL: 0.9 mg/dL (ref 0.2–1.2)
BUN: 19 mg/dL (ref 7–25)
CALCIUM: 9.1 mg/dL (ref 8.6–10.2)
CO2: 25 mmol/L (ref 20–31)
Chloride: 101 mmol/L (ref 98–110)
Creat: 0.82 mg/dL (ref 0.50–1.10)
Glucose, Bld: 81 mg/dL (ref 65–99)
POTASSIUM: 4.4 mmol/L (ref 3.5–5.3)
Sodium: 137 mmol/L (ref 135–146)
TOTAL PROTEIN: 7.3 g/dL (ref 6.1–8.1)

## 2015-05-20 LAB — VITAMIN B12: VITAMIN B 12: 490 pg/mL (ref 211–911)

## 2015-05-20 LAB — TSH: TSH: 1.222 u[IU]/mL (ref 0.350–4.500)

## 2015-05-20 LAB — FOLATE: FOLATE: 9.8 ng/mL

## 2015-06-20 ENCOUNTER — Encounter: Payer: Self-pay | Admitting: Family Medicine

## 2015-06-22 ENCOUNTER — Other Ambulatory Visit: Payer: Self-pay | Admitting: Family Medicine

## 2015-06-25 ENCOUNTER — Encounter: Payer: Self-pay | Admitting: Family Medicine

## 2015-08-15 ENCOUNTER — Other Ambulatory Visit: Payer: Self-pay | Admitting: Family Medicine

## 2015-09-10 ENCOUNTER — Other Ambulatory Visit: Payer: Self-pay | Admitting: Family Medicine

## 2015-09-22 ENCOUNTER — Ambulatory Visit (INDEPENDENT_AMBULATORY_CARE_PROVIDER_SITE_OTHER): Payer: BC Managed Care – PPO

## 2015-09-22 ENCOUNTER — Ambulatory Visit (INDEPENDENT_AMBULATORY_CARE_PROVIDER_SITE_OTHER): Payer: BC Managed Care – PPO | Admitting: Family Medicine

## 2015-09-22 VITALS — BP 118/70 | HR 72 | Temp 97.6°F | Resp 18 | Ht 67.0 in | Wt 197.6 lb

## 2015-09-22 DIAGNOSIS — F4323 Adjustment disorder with mixed anxiety and depressed mood: Secondary | ICD-10-CM | POA: Diagnosis not present

## 2015-09-22 DIAGNOSIS — M79671 Pain in right foot: Secondary | ICD-10-CM

## 2015-09-22 DIAGNOSIS — K219 Gastro-esophageal reflux disease without esophagitis: Secondary | ICD-10-CM | POA: Diagnosis not present

## 2015-09-22 MED ORDER — PANTOPRAZOLE SODIUM 40 MG PO TBEC
DELAYED_RELEASE_TABLET | ORAL | Status: DC
Start: 2015-09-22 — End: 2016-08-27

## 2015-09-22 MED ORDER — ESCITALOPRAM OXALATE 10 MG PO TABS: ORAL_TABLET | ORAL | Status: DC

## 2015-09-22 MED ORDER — HYDROCODONE-ACETAMINOPHEN 5-325 MG PO TABS: 1.0000 | ORAL_TABLET | Freq: Every evening | ORAL | Status: DC | PRN

## 2015-09-22 NOTE — Patient Instructions (Signed)
     IF you received an x-ray today, you will receive an invoice from [facility]. Please contact [facility] at [phone] with questions or concerns regarding your invoice.   IF you received labwork today, you will receive an invoice from [facility]. Please contact [facility] at [phone] with questions or concerns regarding your invoice.   Our billing staff will not be able to assist you with questions regarding bills from these companies.  You will be contacted with the lab results as soon as they are available. The fastest way to get your results is to activate your My Chart account. Instructions are located on the last page of this paperwork. If you have not heard from us regarding the results in 2 weeks, please contact this office.      

## 2015-09-22 NOTE — Progress Notes (Signed)
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 She has a home this morning about 8:00 in the morning when a dresser drawer weighing about 25 pounds fell from about 2 feet and landed on her right foot. She's been having pain in that right foot ever since when she bears weight.  She denies any swelling.  Objective:BP 118/70 mmHg  Pulse 72  Temp(Src) 97.6 F (36.4 C) (Oral)  Resp 18  Ht 5\' 7"  (1.702 m)  Wt 197 lb 9.6 oz (89.631 kg)  BMI 30.94 kg/m2  SpO2   LMP 09/08/2015 Inspection of the foot reveals no bony abnormality or soft tissue swelling. She does have a good dorsalis pedal pulse. On palpation there is tenderness over the navicular of the right foot. Range of motion of the ankle and toes is normal. There is no ecchymosis. There is no abrasion.   [facility] reading (PRIMARY) by  Dr. Milus GlazierLauenstein, MD-> right foot x-ray is negative for fracture.  Plan:  Ace wrap and Norco to take at night. Also refilled the prescriptions as noted Right foot pain - Plan: DG Foot Complete Right, HYDROcodone-acetaminophen (NORCO) 5-325 MG tablet  Adjustment disorder with mixed anxiety and depressed mood - Plan: escitalopram (LEXAPRO) 10 MG tablet  Gastroesophageal reflux disease without esophagitis - Plan: pantoprazole (PROTONIX) 40 MG tablet    Signed,  Zayda Angell, MD

## 2016-04-19 ENCOUNTER — Ambulatory Visit (INDEPENDENT_AMBULATORY_CARE_PROVIDER_SITE_OTHER): Payer: BC Managed Care – PPO | Admitting: Family Medicine

## 2016-04-19 VITALS — BP 114/78 | HR 82 | Temp 98.2°F | Resp 16 | Ht 68.0 in | Wt 201.0 lb

## 2016-04-19 DIAGNOSIS — G44209 Tension-type headache, unspecified, not intractable: Secondary | ICD-10-CM

## 2016-04-19 DIAGNOSIS — J301 Allergic rhinitis due to pollen: Secondary | ICD-10-CM | POA: Diagnosis not present

## 2016-04-19 MED ORDER — FLUTICASONE PROPIONATE 50 MCG/ACT NA SUSP: 2.0000 | Freq: Every day | NASAL | 6 refills | Status: DC

## 2016-04-19 MED ORDER — CETIRIZINE HCL 10 MG PO TABS: 10.0000 mg | ORAL_TABLET | Freq: Every day | ORAL | 11 refills | Status: DC

## 2016-04-19 NOTE — Patient Instructions (Addendum)
IF you received an x-ray today, you will receive an invoice from [facility]. Please contact [facility] at [phone] with questions or concerns regarding your invoice.   IF you received labwork today, you will receive an invoice from [facility]. Please contact [facility] at [phone] with questions or concerns regarding your invoice.   Our billing staff will not be able to assist you with questions regarding bills from these companies.  You will be contacted with the lab results as soon as they are available. The fastest way to get your results is to activate your My Chart account. Instructions are located on the last page of this paperwork. If you have not heard from us regarding the results in 2 weeks, please contact this office.      Tension Headache A tension headache is a feeling of pain, pressure, or aching that is often felt over the front and sides of the head. The pain can be dull, or it can feel tight (constricting). Tension headaches are not normally associated with nausea or vomiting, and they do not get worse with physical activity. Tension headaches can last from 30 minutes to several days. This is the most common type of headache. CAUSES The exact cause of this condition is not known. Tension headaches often begin after stress, anxiety, or depression. Other triggers may include:  Alcohol.  Too much caffeine, or caffeine withdrawal.  Respiratory infections, such as colds, flu, or sinus infections.  Dental problems or teeth clenching.  Fatigue.  Holding your head and neck in the same position for a long period of time, such as while using a computer.  Smoking. SYMPTOMS Symptoms of this condition include:  A feeling of pressure around the head.  Dull, aching head pain.  Pain felt over the front and sides of the head.  Tenderness in the muscles of the head, neck, and shoulders. DIAGNOSIS This condition may be  diagnosed based on your symptoms and a physical exam. Tests may be done, such as a CT scan or an MRI of your head. These tests may be done if your symptoms are severe or unusual. TREATMENT This condition may be treated with lifestyle changes and medicines to help relieve symptoms. HOME CARE INSTRUCTIONS Managing Pain   Take over-the-counter and prescription medicines only as told by your health care provider.  Lie down in a dark, quiet room when you have a headache.  If directed, apply ice to the head and neck area:  Put ice in a plastic bag.  Place a towel between your skin and the bag.  Leave the ice on for 20 minutes, 2-3 times per day.  Use a heating pad or a hot shower to apply heat to the head and neck area as told by your health care provider. Eating and Drinking   Eat meals on a regular schedule.  Limit alcohol use.  Decrease your caffeine intake, or stop using caffeine. General Instructions   Keep all follow-up visits as told by your health care provider. This is important.  Keep a headache journal to help find out what may trigger your headaches. For example, write down:  What you eat and drink.  How much sleep you get.  Any change to your diet or medicines.  Try massage or other relaxation techniques.  Limit stress.  Sit up straight, and avoid tensing your muscles.  Do not use tobacco products, including cigarettes, chewing tobacco, or e-cigarettes. If you need help quitting, ask your  health care provider.  Exercise regularly as told by your health care provider.  Get 7-9 hours of sleep, or the amount recommended by your health care provider. SEEK MEDICAL CARE IF:  Your symptoms are not helped by medicine.  You have a headache that is different from what you normally experience.  You have nausea or you vomit.  You have a fever. SEEK IMMEDIATE MEDICAL CARE IF:  Your headache becomes severe.  You have repeated vomiting.  You have a stiff  neck.  You have a loss of vision.  You have problems with speech.  You have pain in your eye or ear.  You have muscular weakness or loss of muscle control.  You lose your balance or you have trouble walking.  You feel faint or you pass out.  You have confusion. This information is not intended to replace advice given to you by your health care provider. Make sure you discuss any questions you have with your health care provider. Document Released: 05/17/2005 Document Revised: 02/05/2015 Document Reviewed: 09/09/2014 Elsevier Interactive Patient Education  2017 [employer]  Cluster Headache A cluster headache is a type of headache that causes deep, intense head pain. Cluster headaches can last from 15 minutes to 3 hours. They usually occur:  On one side of the head. They may occur on the other side when a new cluster of headaches begins.  Repeatedly over weeks to months.  Several times a day.  At the same time of day, often at night.  More often in the fall and springtime. What are the causes? The cause of this condition is not known. What increases the risk? This condition is more likely to develop in:  Males.  People who drink alcohol.  People who smoke or use products that contain nicotine or tobacco.  People who take medicines that cause blood vessels to expand, such as nitroglycerin.  People who take antihistamines. What are the signs or symptoms? Symptoms of this condition include:  Severe pain on one side of the head that begins behind or around your eye or temple.  Pain on one side of the head.  Nausea.  Sensitivity to light.  Runny nose and nasal stuffiness.  Sweaty, pale skin on the face.  Droopy or swollen eyelid, eye redness, or tearing.  Restlessness and agitation. How is this diagnosed? This condition may be diagnosed based on:  Your symptoms.  A physical exam. Your health care provider may order tests to see if your headaches are  caused by another medical condition. These tests may show that you do not have cluster headaches. Tests may include:  A CT scan of your head.  An MRI of your head.  Lab tests. How is this treated? This condition may be treated with:  Medicines to relieve pain and to prevent repeated (recurrent) attacks. Some people may need a combination of medicines.  Oxygen. This helps to relieve pain. Follow these instructions at home: Headache diary  Keep a headache diary as told by your health care provider. Doing this can help you and your health care provider figure out what triggers your headaches. In your headache diary, include information about:  The time of day that your headache started and what you were doing when it began.  How long your headache lasted.  Where your pain started and whether it moved to other areas.  The type of pain, such as burning, stabbing, throbbing, or cramping.  Your level of pain. Use a pain scale and  rate the pain with a number from 1 (mild) up to 10 (severe).  The treatment that you used, and any change in symptoms after treatment. Medicines  Take over-the-counter and prescription medicines only as told by your health care provider.  Do not drive or use heavy machinery while taking prescription pain medicine.  Use oxygen as told by your health care provider. Lifestyle  Follow a regular sleep schedule. Do not vary the time that you go to bed or the amount that you sleep from day to day. It is important to stay on the same schedule during a cluster period to help prevent headaches.  Exercise regularly.  Eat a healthy diet and avoid foods that may trigger your headaches.  Avoid alcohol.  Do not use any products that contain nicotine or tobacco, such as cigarettes and e-cigarettes. If you need help quitting, ask your health care provider. Contact a health care provider if:  Your headaches change, become more severe, or occur more often.  The  medicine or oxygen that your health care provider recommended does not help. Get help right away if:  You faint.  You have weakness or numbness, especially on one side of your body or face.  You have double vision.  You have nausea or vomiting that does not go away within several hours.  You have trouble talking, walking, or keeping your balance.  You have pain or stiffness in your neck.  You have a fever. Summary  A cluster headache is a type of headache that causes deep, intense head pain, usually on one side of the head.  Keep a headache diary to help discover what triggers your headaches.  A regular sleep schedule can help prevent headaches. This information is not intended to replace advice given to you by your health care provider. Make sure you discuss any questions you have with your health care provider. Document Released: 05/17/2005 Document Revised: 01/27/2016 Document Reviewed: 01/27/2016 Elsevier Interactive Patient Education  2017 [employer].

## 2016-04-19 NOTE — Progress Notes (Signed)
Chief Complaint  Patient presents with  . Headache    sharp, towards the back of her head, last few days    HPI Pt reports that out of no where there is asharp pain in the back of her head.  She reports that the headache is confined to the left side of the head in the occipital area.  She reports that the pain can be triggered by even patting or scratching her scalp.  She states that the pain lasts a few seconds.  She reports that a year ago she fell on her neck and had a neck injury  She also gets some itchy ears and pressure in her ear. Denies fever or chills Denies runny nose   No past medical history on file.  Current Outpatient Prescriptions  Medication Sig Dispense Refill  . escitalopram (LEXAPRO) 10 MG tablet TAKE 1 TABLET BY MOUTH DAILY. 90 tablet 3  . pantoprazole (PROTONIX) 40 MG tablet TAKE 1 TABLET (40 MG TOTAL) BY MOUTH DAILY. 90 tablet 3  . cetirizine (ZYRTEC) 10 MG tablet Take 1 tablet (10 mg total) by mouth daily. 30 tablet 11  . fluticasone (FLONASE) 50 MCG/ACT nasal spray Place 2 sprays into both nostrils daily. 16 g 6  . HYDROcodone-acetaminophen (NORCO) 5-325 MG tablet Take 1 tablet by mouth at bedtime and may repeat dose one time if needed. (Patient not taking: Reported on 04/19/2016) 12 tablet 0  . ranitidine (ZANTAC) 150 MG tablet Take 1 tablet (150 mg total) by mouth 2 (two) times daily. (Patient not taking: Reported on 04/19/2016) 60 tablet 0   No current facility-administered medications for this visit.     Allergies: No Known Allergies  Past Surgical History:  Procedure Laterality Date  . CESAREAN SECTION    . LAPAROSCOPIC GASTRIC SLEEVE RESECTION      Social History   Social History  . Marital status: Single    Spouse name: N/A  . Number of children: N/A  . Years of education: N/A   Social History Main Topics  . Smoking status: Never Smoker  . Smokeless tobacco: Never Used  . Alcohol use 0.0 oz/week     Comment: socially  . Drug use: No    . Sexual activity: No   Other Topics Concern  . None   Social History Narrative  . None    ROS  Objective: Vitals:   04/19/16 1751  BP: 114/78  Pulse: 82  Resp: 16  Temp: 98.2 F (36.8 C)  Weight: 201 lb (91.2 kg)  Height: 5\' 8"  (1.727 m)    Physical Exam  Constitutional: She is oriented to person, place, and time. She appears well-developed and well-nourished.  HENT:  Head: Normocephalic and atraumatic.    Right Ear: External ear normal.  Left Ear: External ear normal.  Eyes: Conjunctivae and EOM are normal.  Cardiovascular: Normal rate, regular rhythm and normal heart sounds.   No murmur heard. Pulmonary/Chest: Breath sounds normal. No respiratory distress.  Musculoskeletal: Normal range of motion.  Neurological: She is alert and oriented to person, place, and time. She displays normal reflexes. No cranial nerve deficit. She exhibits normal muscle tone. Coordination normal.    Assessment and Plan Mariah was seen today for headache.  Diagnoses and all orders for this visit:  Acute seasonal allergic rhinitis due to pollen -    Advised pt to start with allergy medications for her ear dryness and  -     cetirizine (ZYRTEC) 10 MG tablet; Take 1 tablet (  10 mg total) by mouth daily. -     fluticasone (FLONASE) 50 MCG/ACT nasal spray; Place 2 sprays into both nostrils daily.  Tension-type headache, not intractable, unspecified chronicity pattern -  Advised pt to keep a journal of headaches Discussed that she should continue to monitor her symptoms To alleviate pt should avoid carrying her purse on that side (left) and to use heat packs        Yee Joss A Creta LevinStallings

## 2016-05-15 ENCOUNTER — Emergency Department (HOSPITAL_COMMUNITY): Payer: BC Managed Care – PPO

## 2016-05-15 ENCOUNTER — Emergency Department (HOSPITAL_COMMUNITY)
Admission: EM | Admit: 2016-05-15 | Discharge: 2016-05-16 | Disposition: A | Discharge status: Home / Self Care | Payer: BC Managed Care – PPO | Attending: Emergency Medicine | Admitting: Emergency Medicine

## 2016-05-15 ENCOUNTER — Encounter (HOSPITAL_COMMUNITY): Payer: Self-pay | Admitting: Emergency Medicine

## 2016-05-15 DIAGNOSIS — R079 Chest pain, unspecified: Secondary | ICD-10-CM

## 2016-05-15 LAB — CBC
HCT: 40.1 % (ref 36.0–46.0)
Hemoglobin: 14.8 g/dL (ref 12.0–15.0)
MCH: 30.1 pg (ref 26.0–34.0)
MCHC: 36.9 g/dL — AB (ref 30.0–36.0)
MCV: 81.5 fL (ref 78.0–100.0)
Platelets: 230 10*3/uL (ref 150–400)
RBC: 4.92 MIL/uL (ref 3.87–5.11)
RDW: 13.3 % (ref 11.5–15.5)
WBC: 6.4 10*3/uL (ref 4.0–10.5)

## 2016-05-15 LAB — BASIC METABOLIC PANEL
Anion gap: 8 (ref 5–15)
BUN: 19 mg/dL (ref 6–20)
CALCIUM: 9.6 mg/dL (ref 8.9–10.3)
CO2: 24 mmol/L (ref 22–32)
CREATININE: 0.83 mg/dL (ref 0.44–1.00)
Chloride: 105 mmol/L (ref 101–111)
GFR calc non Af Amer: >60 mL/min (ref >60)
GLUCOSE: 87 mg/dL (ref 65–99)
Potassium: 5.6 mmol/L — ABNORMAL HIGH (ref 3.5–5.1)
Sodium: 137 mmol/L (ref 135–145)

## 2016-05-15 LAB — I-STAT TROPONIN, ED: TROPONIN I, POC: 0 ng/mL (ref 0.00–0.08)

## 2016-05-15 NOTE — ED Triage Notes (Signed)
Pt reports left sided chest pain onset tonight radiating down left arm. Pt denies any SOB, dizziness or nausea.

## 2016-05-15 NOTE — ED Provider Notes (Signed)
[facility]-EMERGENCY DEPT Provider Note   CSN: [id number] Arrival date & time: 05/15/16  2149  By signing my name below, I, Valentino SaxonBianca Contreras, attest that this documentation has been prepared under the direction and in the presence of Zadie Rhineonald Anjoli Diemer, MD. Electronically Signed: Valentino SaxonBianca Contreras, ED Scribe. 05/15/16. 11:27 PM.  History   Chief Complaint Chief Complaint  Patient presents with  . Chest Pain   The history is provided by the patient. No language interpreter was used.   HPI Comments: Jillian [name] is a 40 y.o. female who presents to the Emergency Department complaining of sudden onset, upper left CP that occurred at ~7pm today. She notes her CP radiates to her left arm with a tingling sensation. Pt notes she had returned from a christmas gathering and began experiencing her CP. Pt notes having a burning sensation with her CP lasting a couple of hours.  Pt states she took one ibuprofen for her CP with minimal relief. She states she vomited once this morning. She denies sweating, feeling out of breath, abdominal pain. Pt denies SOB, fever, HA, weakness. She denies hx of blood clots, HTN, MI. She denies being a former smoker. No additional complaints at this time.    PMH - none  Patient Active Problem List   Diagnosis Date Noted  . Generalized anxiety disorder 01/06/2015  . Esophageal reflux 01/06/2015    Past Surgical History:  Procedure Laterality Date  . CESAREAN SECTION    . LAPAROSCOPIC GASTRIC SLEEVE RESECTION      OB History    No data available       Home Medications    Prior to Admission medications   Medication Sig Start Date End Date Taking? Authorizing Provider  escitalopram (LEXAPRO) 10 MG tablet TAKE 1 TABLET BY MOUTH DAILY. Patient taking differently: Take 10 mg by mouth every morning.  09/22/15  Yes Elvina SidleKurt Lauenstein, MD  ibuprofen (ADVIL,MOTRIN) 800 MG tablet Take 800 mg by mouth every 8 (eight) hours as needed for headache, mild pain or moderate pain.   05/12/16  Yes Historical Provider, MD  pantoprazole (PROTONIX) 40 MG tablet TAKE 1 TABLET (40 MG TOTAL) BY MOUTH DAILY. Patient taking differently: Take 40 mg by mouth every morning.  09/22/15  Yes Elvina SidleKurt Lauenstein, MD  cetirizine (ZYRTEC) 10 MG tablet Take 1 tablet (10 mg total) by mouth daily. Patient not taking: Reported on 05/15/2016 04/19/16   Doristine BosworthZoe A Stallings, MD  fluticasone (FLONASE) 50 MCG/ACT nasal spray Place 2 sprays into both nostrils daily. Patient not taking: Reported on 05/15/2016 04/19/16   Doristine BosworthZoe A Stallings, MD  ranitidine (ZANTAC) 150 MG tablet Take 1 tablet (150 mg total) by mouth 2 (two) times daily. Patient not taking: Reported on 05/15/2016 05/19/15   Garnetta BuddyStephanie D English, PA    Family History Family History  Problem Relation Age of Onset  . Diabetes Mother   . Hyperlipidemia Mother   . Hypertension Mother   . Hyperlipidemia Sister   . Diabetes Maternal Grandfather   . Heart disease Paternal Grandmother     Social History Social History  Substance Use Topics  . Smoking status: Never Smoker  . Smokeless tobacco: Never Used  . Alcohol use 0.0 oz/week     Comment: socially     Allergies   Patient has no known allergies.   Review of Systems Review of Systems  Constitutional: Negative for fever.  Respiratory: Negative for shortness of breath.   Cardiovascular: Positive for chest pain.  Gastrointestinal: Positive for vomiting. Negative for  abdominal pain.  Neurological: Negative for weakness and headaches.  All other systems reviewed and are negative.    Physical Exam Updated Vital Signs BP 126/73 (BP Location: Left Arm)   Pulse 67   Temp 98.3 F (36.8 C) (Oral)   Resp 18   Ht 5\' 7"  (1.702 m)   Wt 200 lb (90.7 kg)   LMP 05/08/2016   SpO2 97%   BMI 31.32 kg/m   Physical Exam CONSTITUTIONAL: Well developed/well nourished HEAD: Normocephalic/atraumatic ENMT: Mucous membranes moist NECK: supple no meningeal signs SPINE/BACK:entire spine  nontender CV: S1/S2 noted, no murmurs/rubs/gallops noted LUNGS: Lungs are clear to auscultation bilaterally, no apparent distress ABDOMEN: soft, nontender, no rebound or guarding, bowel sounds noted throughout abdomen GU:no cva tenderness NEURO: Pt is awake/alert/appropriate, moves all extremitiesx4.  No facial droop.   EXTREMITIES: pulses normal/equal, full ROM SKIN: warm, color normal PSYCH: no abnormalities of mood noted, alert and oriented to situation   ED Treatments / Results   DIAGNOSTIC STUDIES: Oxygen Saturation is 97% on RA, adequate by my interpretation.    COORDINATION OF CARE: 11:23 PM Discussed treatment plan with pt at bedside which includes labs, chest XR, EKG and pt agreed to plan.   Labs (all labs ordered are listed, but only abnormal results are displayed) Labs Reviewed  BASIC METABOLIC PANEL - Abnormal; Notable for the following:       Result Value   Potassium 5.6 (*)    All other components within normal limits  CBC - Abnormal; Notable for the following:    MCHC 36.9 (*)    All other components within normal limits  I-STAT TROPOININ, ED    EKG  EKG Interpretation  Date/Time:  Saturday May 15 2016 21:59:10 EST Ventricular Rate:  75 PR Interval:    QRS Duration: 96 QT Interval:  380 QTC Calculation: 425 R Axis:   59 Text Interpretation:  Sinus rhythm Normal ECG No previous ECGs available Confirmed by Bebe ShaggyWICKLINE  MD, Letticia Bhattacharyya ([id number]) on 05/15/2016 11:18:58 PM       Radiology Dg Chest 2 View  Result Date: 05/15/2016 CLINICAL DATA:  Initial evaluation for acute left-sided chest pain radiating down left arm. EXAM: CHEST  2 VIEW COMPARISON:  None available. FINDINGS: The cardiac and mediastinal silhouettes are within normal limits. The lungs are normally inflated. No airspace consolidation, pleural effusion, or pulmonary edema is identified. There is no pneumothorax. No acute osseous abnormality identified. IMPRESSION: No active cardiopulmonary  disease. Electronically Signed   By: Rise MuBenjamin  McClintock M.D.   On: 05/15/2016 22:15    Procedures Procedures (including critical care time)  Medications Ordered in ED Medications - No data to display   Initial Impression / Assessment and Plan / ED Course  I have reviewed the triage vital signs and the nursing notes.  Pertinent labs & imaging results that were available during my care of the patient were reviewed by me and considered in my medical decision making (see chart for details).  Clinical Course     HEART score 2 Labs unremarkable, troponin negative She had no return of CP in the ED I feel she is safe for d/c home We discussed return precautions  She did have one isolated episode of pulse ox at 85%, though no other hypoxia, suspect this was error, as pt had no CP/SOB whle in the ED  Final Clinical Impressions(s) / ED Diagnoses   Final diagnoses:  Chest pain, unspecified type    New Prescriptions New Prescriptions   No medications  on file    I personally performed the services described in this documentation, which was scribed in my presence. The recorded information has been reviewed and is accurate.        Zadie Rhine, MD 05/16/16 [phone]

## 2016-05-15 NOTE — ED Notes (Signed)
One attempt unsuccessful made to collect blood samples. Blood to be collected when IV is started. .Marland Kitchen

## 2016-05-15 NOTE — ED Notes (Signed)
ED Provider at bedside. 

## 2016-05-15 NOTE — ED Notes (Signed)
Patient transported to X-ray 

## 2016-05-16 LAB — I-STAT TROPONIN, ED: TROPONIN I, POC: 0 ng/mL (ref 0.00–0.08)

## 2016-05-16 LAB — I-STAT CHEM 8, ED
BUN: 23 mg/dL — ABNORMAL HIGH (ref 6–20)
Calcium, Ion: 1.09 mmol/L — ABNORMAL LOW (ref 1.15–1.40)
Chloride: 107 mmol/L (ref 101–111)
Creatinine, Ser: 0.8 mg/dL (ref 0.44–1.00)
Glucose, Bld: 72 mg/dL (ref 65–99)
HEMATOCRIT: 36 % (ref 36.0–46.0)
HEMOGLOBIN: 12.2 g/dL (ref 12.0–15.0)
POTASSIUM: 4.4 mmol/L (ref 3.5–5.1)
SODIUM: 137 mmol/L (ref 135–145)
TCO2: 22 mmol/L (ref 0–100)

## 2016-05-16 NOTE — Discharge Instructions (Signed)

## 2016-08-27 ENCOUNTER — Ambulatory Visit (INDEPENDENT_AMBULATORY_CARE_PROVIDER_SITE_OTHER): Payer: BC Managed Care – PPO

## 2016-08-27 ENCOUNTER — Ambulatory Visit (INDEPENDENT_AMBULATORY_CARE_PROVIDER_SITE_OTHER): Payer: BC Managed Care – PPO | Admitting: Physician Assistant

## 2016-08-27 VITALS — BP 111/73 | HR 54 | Temp 98.1°F | Resp 18 | Ht 68.0 in | Wt 198.5 lb

## 2016-08-27 DIAGNOSIS — G8929 Other chronic pain: Secondary | ICD-10-CM

## 2016-08-27 DIAGNOSIS — E669 Obesity, unspecified: Secondary | ICD-10-CM | POA: Diagnosis not present

## 2016-08-27 DIAGNOSIS — M546 Pain in thoracic spine: Secondary | ICD-10-CM

## 2016-08-27 DIAGNOSIS — Z9884 Bariatric surgery status: Secondary | ICD-10-CM | POA: Diagnosis not present

## 2016-08-27 DIAGNOSIS — R21 Rash and other nonspecific skin eruption: Secondary | ICD-10-CM

## 2016-08-27 DIAGNOSIS — K219 Gastro-esophageal reflux disease without esophagitis: Secondary | ICD-10-CM | POA: Diagnosis not present

## 2016-08-27 DIAGNOSIS — F4323 Adjustment disorder with mixed anxiety and depressed mood: Secondary | ICD-10-CM

## 2016-08-27 DIAGNOSIS — N62 Hypertrophy of breast: Secondary | ICD-10-CM

## 2016-08-27 DIAGNOSIS — Z6830 Body mass index (BMI) 30.0-30.9, adult: Secondary | ICD-10-CM

## 2016-08-27 LAB — POCT SKIN KOH: Skin KOH, POC: NEGATIVE

## 2016-08-27 MED ORDER — CYCLOBENZAPRINE HCL 10 MG PO TABS: 5.0000 mg | ORAL_TABLET | Freq: Three times a day (TID) | ORAL | 0 refills | Status: DC | PRN

## 2016-08-27 MED ORDER — ESCITALOPRAM OXALATE 10 MG PO TABS: ORAL_TABLET | ORAL | 3 refills | Status: DC

## 2016-08-27 MED ORDER — PANTOPRAZOLE SODIUM 40 MG PO TBEC: DELAYED_RELEASE_TABLET | ORAL | 3 refills | Status: DC

## 2016-08-27 MED ORDER — MELOXICAM 7.5 MG PO TABS: 7.5000 mg | ORAL_TABLET | Freq: Every day | ORAL | 0 refills | Status: DC

## 2016-08-27 MED ORDER — RANITIDINE HCL 150 MG PO TABS: 150.0000 mg | ORAL_TABLET | Freq: Two times a day (BID) | ORAL | 1 refills | Status: DC

## 2016-08-27 NOTE — Progress Notes (Signed)
PRIMARY CARE AT [facility] [address] [place] [address] [phone]  Date:  08/27/2016   Name:  Jillian [name]   DOB:  December 25, 1975   MRN:  [id number]  PCP:  No PCP Per Patient    History of Present Illness:  Jillian [name] is a 41 y.o. female patient who presents to PCP with  Chief Complaint  Patient presents with  . Back Pain  . Medication Refill    Lexapro & Protonix  . Flu Vaccine     She has pain along the midback which may radiate into her shoulders.  Post bariatric surgery(gastric sleeve), her breast size was last noted to be size I or G, but she has resorted to wearing three bras at this time, due to the weight of her breast.  There is no numbness or tingling.  She has pain that has now affected her midback as she is sitting at her desk at work.  She positions herself ergonimically.  No heavy lifting or trauma noted. lexapro: working well at dose.  Takes compliantly.  She finds that she is a lot more calm with this medication.   protonix: currently has some increased reflux.   She is concerned of her weight gain.  Finds that she always needs food in her mouth.  She tends to snack at night with potato chips or cookies.  At work, she    She is wearing three bras. 02/17/2015--182lbs.  She is gradually gaining weight.  She does not know what she can do to curb her appetite, and avoid constant eating.     Wt Readings from Last 3 Encounters:  08/27/16 198 lb 8 oz (90 kg)  05/15/16 200 lb (90.7 kg)  04/19/16 201 lb (91.2 kg)     Patient Active Problem List   Diagnosis Date Noted  . Generalized anxiety disorder 01/06/2015  . Esophageal reflux 01/06/2015    No past medical history on file.  Past Surgical History:  Procedure Laterality Date  . CESAREAN SECTION    . LAPAROSCOPIC GASTRIC SLEEVE RESECTION      Social History  Substance Use Topics  . Smoking status: Never Smoker  . Smokeless tobacco: Never Used  . Alcohol use 0.0 oz/week     Comment: socially     Family History  Problem Relation Age of Onset  . Diabetes Mother   . Hyperlipidemia Mother   . Hypertension Mother   . Hyperlipidemia Sister   . Diabetes Maternal Grandfather   . Heart disease Paternal Grandmother     No Known Allergies  Medication list has been reviewed and updated.  Current Outpatient Prescriptions on File Prior to Visit  Medication Sig Dispense Refill  . escitalopram (LEXAPRO) 10 MG tablet TAKE 1 TABLET BY MOUTH DAILY. (Patient taking differently: Take 10 mg by mouth every morning. ) 90 tablet 3  . pantoprazole (PROTONIX) 40 MG tablet TAKE 1 TABLET (40 MG TOTAL) BY MOUTH DAILY. (Patient taking differently: Take 40 mg by mouth every morning. ) 90 tablet 3  . cetirizine (ZYRTEC) 10 MG tablet Take 1 tablet (10 mg total) by mouth daily. (Patient not taking: Reported on 05/15/2016) 30 tablet 11  . fluticasone (FLONASE) 50 MCG/ACT nasal spray Place 2 sprays into both nostrils daily. (Patient not taking: Reported on 05/15/2016) 16 g 6  . ibuprofen (ADVIL,MOTRIN) 800 MG tablet Take 800 mg by mouth every 8 (eight) hours as needed for headache, mild pain or moderate pain.     . ranitidine (ZANTAC) 150  MG tablet Take 1 tablet (150 mg total) by mouth 2 (two) times daily. (Patient not taking: Reported on 05/15/2016) 60 tablet 0   No current facility-administered medications on file prior to visit.     ROS ROS otherwise unremarkable unless listed above.  Physical Examination: BP 111/73   Pulse (!) 54   Temp 98.1 F (36.7 C) (Oral)   Resp 18   Ht  (1.727 m)   Wt 198 lb 8 oz (90 kg)   LMP 08/23/2016 (Approximate)   SpO2 (!) 82%   BMI 30.18 kg/m  Ideal Body Weight: Weight in (lb) to have BMI = 25: 164.1  Physical Exam  Constitutional: She is oriented to person, place, and time. She appears well-developed and well-nourished. No distress.  HENT:  Head: Normocephalic and atraumatic.  Right Ear: External ear normal.  Left Ear: External ear normal.  Eyes:  Conjunctivae and EOM are normal. Pupils are equal, round, and reactive to light.  Cardiovascular: Normal rate and regular rhythm.  Exam reveals no friction rub.   No murmur heard. Pulmonary/Chest: Effort normal. No apnea. No respiratory distress. She has no decreased breath sounds. She has no wheezes.  Breast are dense and large for present stature.    Musculoskeletal:       Thoracic back: She exhibits tenderness and bony tenderness (tender along the around t8 and adjacent musculature). She exhibits no swelling.  Neurological: She is alert and oriented to person, place, and time.  Skin: She is not diaphoretic.  Psychiatric: She has a normal mood and affect. Her behavior is normal.   Dg Thoracic Spine 2 View  Result Date: 08/27/2016 CLINICAL DATA:  Chronic thoracic back pain. EXAM: THORACIC SPINE 2 VIEWS COMPARISON:  Radiographs of May 15, 2016. FINDINGS: There is no evidence of thoracic spine fracture. Alignment is normal. No other significant bone abnormalities are identified. IMPRESSION: Normal thoracic spine. Electronically Signed   By: Lupita Raider, M.D.   On: 08/27/2016 08:47     Assessment and Plan: [name] is a 41 y.o. female who is here today for cc of mid backc pain and medication refills. Back pain likely secondary to breast size.  Advised ice and nsaid at this time.  She is interested in discussing breast reduction.  At this time, I will refer her to reconstruction sugery for evalatuion. Will also refer to nutrition.   She has had major gains with the bariatric surgery, and would do best with re-education of appropriate meals.  Reflux may be a result of foods, as well as weight gain.  Will add h2blocker for temporary relief. Refilling lexapro for 1 year. Chronic midline thoracic back pain - Plan: DG Thoracic Spine 2 View, meloxicam (MOBIC) 7.5 MG tablet, cyclobenzaprine (FLEXERIL) 10 MG tablet  Gastroesophageal reflux disease, esophagitis presence not specified -  Plan: ranitidine (ZANTAC) 150 MG tablet, pantoprazole (PROTONIX) 40 MG tablet  Adjustment disorder with mixed anxiety and depressed mood - Plan: escitalopram (LEXAPRO) 10 MG tablet  Gastroesophageal reflux disease without esophagitis - Plan: pantoprazole (PROTONIX) 40 MG tablet  Rash and nonspecific skin eruption - Plan: POCT Skin KOH  Trena Platt, PA-C [facility] 3/31/20181:42 PM

## 2016-08-27 NOTE — Patient Instructions (Addendum)
You can continue the tylenol.   Ice the back three times per day for 15 minutes.   I am placing a referral with reconstruction.  Please await that call.   I would like you to start doing these stretches in the meantime, to exercise the back.   Mid-Back Strain Rehab Ask your health care provider which exercises are safe for you. Do exercises exactly as told by your health care provider and adjust them as directed. It is normal to feel mild stretching, pulling, tightness, or discomfort as you do these exercises, but you should stop right away if you feel sudden pain or your pain gets worse. Do not begin these exercises until told by your health care provider. Stretching and range of motion exercises This exercise warms up your muscles and joints and improves the movement and flexibility of your back and shoulders. This exercise also help to relieve pain. Exercise A: Chest and spine stretch   1. Lie down on your back on a firm surface. 2. Roll a towel or a small blanket so it is about 4 inches (10 cm) in diameter. 3. Put the towel lengthwise under the middle of your back so it is under your spine, but not under your shoulder blades. 4. To increase the stretch, you may put your hands behind your head and let your elbows fall to your sides. 5. Hold for __________ seconds. Repeat exercise __________ times. Complete this exercise __________ times a day. Strengthening exercises These exercises build strength and endurance in your back and your shoulder blade muscles. Endurance is the ability to use your muscles for a long time, even after they get tired. Exercise B: Alternating arm and leg raises   1. Get on your hands and knees on a firm surface. If you are on a hard floor, you may want to use padding to cushion your knees, such as an exercise mat. 2. Line up your arms and legs. Your hands should be below your shoulders, and your knees should be below your hips. 3. Lift your left leg behind you. At  the same time, raise your right arm and straighten it in front of you.  Do not lift your leg higher than your hip.  Do not lift your arm higher than your shoulder.  Keep your abdominal and back muscles tight.  Keep your hips facing the ground.  Do not arch your back.  Keep your balance carefully, and do not hold your breath. 4. Hold for __________ seconds. 5. Slowly return to the starting position and repeat with your right leg and your left arm. Repeat __________ times. Complete this exercise __________ times a day. Exercise C: Straight arm rows (  shoulder extension) 1. Stand with your feet shoulder width apart. 2. Secure an exercise band to a stable object in front of you so the band is at or above shoulder height. 3. Hold one end of the exercise band in each hand. 4. Straighten your elbows and lift your hands up to shoulder height. 5. Step back, away from the secured end of the exercise band, until the band stretches. 6. Squeeze your shoulder blades together and pull your hands down to the sides of your thighs. Stop when your hands are straight down by your sides. Do not let your hands go behind your body. 7. Hold for __________ seconds. 8. Slowly return to the starting position. Repeat __________ times. Complete this exercise __________ times a day. Exercise D: Shoulder external rotation, prone 1. Lorenz Coaster  on your abdomen on a firm bed so your left / right forearm hangs over the edge of the bed and your upper arm is on the bed, straight out from your body.  Your elbow should be bent.  Your palm should be facing your feet. 2. If instructed, hold a __________ weight in your hand. 3. Squeeze your shoulder blade toward the middle of your back. Do not let your shoulder lift toward your ear. 4. Keep your elbow bent in an "L" shape (90 degrees) while you slowly move your forearm up toward the ceiling. Move your forearm up to the height of the bed, toward your head.  Your upper arm  should not move.  At the top of the movement, your palm should face the floor. 5. Hold for __________ seconds. 6. Slowly return to the starting position and relax your muscles. Repeat __________ times. Complete this exercise __________ times a day. Exercise E: Scapular retraction and external rotation, rowing  1. Sit in a stable chair without armrests, or stand. 2. Secure an exercise band to a stable object in front of you so it is at shoulder height. 3. Hold one end of the exercise band in each hand. 4. Bring your arms out straight in front of you. 5. Step back, away from the secured end of the exercise band, until the band stretches. 6. Pull the band backward. As you do this, bend your elbows and squeeze your shoulder blades together, but avoid letting the rest of your body move. Do not let your shoulders lift up toward your ears. 7. Stop when your elbows are at your sides or slightly behind your body. 8. Hold for __________ seconds. 9. Slowly straighten your arms to return to the starting position. Repeat __________ times. Complete this exercise __________ times a day. Posture and body mechanics  Body mechanics refers to the movements and positions of your body while you do your daily activities. Posture is part of body mechanics. Good posture and healthy body mechanics can help to relieve stress in your body's tissues and joints. Good posture means that your spine is in its natural S-curve position (your spine is neutral), your shoulders are pulled back slightly, and your head is not tipped forward. The following are general guidelines for applying improved posture and body mechanics to your everyday activities. Standing   When standing, keep your spine neutral and your feet about hip-width apart. Keep a slight bend in your knees. Your ears, shoulders, and hips should line up.  When you do a task in which you lean forward while standing in one place for a long time, place one foot up on  a stable object that is 2-4 inches (5-10 cm) high, such as a footstool. This helps keep your spine neutral. Sitting   When sitting, keep your spine neutral and keep your feet flat on the floor. Use a footrest, if necessary, and keep your thighs parallel to the floor. Avoid rounding your shoulders, and avoid tilting your head forward.  When working at a desk or a computer, keep your desk at a height where your hands are slightly lower than your elbows. Slide your chair under your desk so you are close enough to maintain good posture.  When working at a computer, place your monitor at a height where you are looking straight ahead and you do not have to tilt your head forward or downward to look at the screen. Resting  When lying down and resting, avoid positions that  are most painful for you.  If you have pain with activities such as sitting, bending, stooping, or squatting (flexion-based activities), lie in a position in which your body does not bend very much. For example, avoid curling up on your side with your arms and knees near your chest (fetal position).  If you have pain with activities such as standing for a long time or reaching with your arms (extension-based activities), lie with your spine in a neutral position and bend your knees slightly. Try the following positions:  Lying on your side with a pillow between your knees.  Lying on your back with a pillow under your knees. Lifting   When lifting objects, keep your feet at least shoulder-width apart and tighten your abdominal muscles.  Bend your knees and hips and keep your spine neutral. It is important to lift using the strength of your legs, not your back. Do not lock your knees straight out.  Always ask for help to lift heavy or awkward objects. This information is not intended to replace advice given to you by your health care provider. Make sure you discuss any questions you have with your health care provider. Document  Released: 05/17/2005 Document Revised: 01/22/2016 Document Reviewed: 02/26/2015 Elsevier Interactive Patient Education  2017 [employer]  Thoracic Strain Thoracic strain is an injury to the muscles or tendons that attach to the upper back. A strain can be mild or severe. A mild strain may take only 1-2 weeks to heal. A severe strain involves torn muscles or tendons, so it may take 6-8 weeks to heal. Follow these instructions at home:  Rest as needed. Limit your activity as told by your doctor.  If directed, put ice on the injured area:  Put ice in a plastic bag.  Place a towel between your skin and the bag.  Leave the ice on for 20 minutes, 2-3 times per day.  Take over-the-counter and prescription medicines only as told by your doctor.  Begin doing exercises as told by your doctor or physical therapist.  Warm up before being active.  Bend your knees before you lift heavy objects.  Keep all follow-up visits as told by your doctor. This is important. Contact a doctor if:  Your pain is not helped by medicine.  Your pain, bruising, or swelling is getting worse.  You have a fever. Get help right away if:  You have shortness of breath.  You have chest pain.  You have weakness or loss of feeling (numbness) in your legs.  You cannot control when you pee (urinate). This information is not intended to replace advice given to you by your health care provider. Make sure you discuss any questions you have with your health care provider. Document Released: 11/03/2007 Document Revised: 01/17/2016 Document Reviewed: 07/11/2014 Elsevier Interactive Patient Education  2017 [employer].    IF you received an x-ray today, you will receive an invoice from [facility]. Please contact [facility] at [phone] with questions or concerns regarding your invoice.   IF you received labwork today, you will receive an invoice from [facility]. Please contact [facility] at  [phone] with questions or concerns regarding your invoice.   Our billing staff will not be able to assist you with questions regarding bills from these companies.  You will be contacted with the lab results as soon as they are available. The fastest way to get your results is to activate your My Chart account. Instructions are located on the last page of  this paperwork. If you have not heard from [place] regarding the results in 2 weeks, please contact this office.

## 2016-09-26 ENCOUNTER — Other Ambulatory Visit: Payer: Self-pay | Admitting: Physician Assistant

## 2016-09-26 DIAGNOSIS — G8929 Other chronic pain: Secondary | ICD-10-CM

## 2016-09-26 DIAGNOSIS — M546 Pain in thoracic spine: Principal | ICD-10-CM

## 2016-09-29 ENCOUNTER — Ambulatory Visit: Payer: BC Managed Care – PPO | Admitting: Skilled Nursing Facility1

## 2016-09-30 NOTE — Telephone Encounter (Signed)
08/27/16 last ov and refill

## 2016-10-23 ENCOUNTER — Other Ambulatory Visit: Payer: Self-pay | Admitting: Physician Assistant

## 2016-10-23 DIAGNOSIS — K219 Gastro-esophageal reflux disease without esophagitis: Secondary | ICD-10-CM

## 2016-11-03 ENCOUNTER — Ambulatory Visit: Payer: BC Managed Care – PPO | Admitting: Skilled Nursing Facility1

## 2016-12-11 ENCOUNTER — Encounter (HOSPITAL_COMMUNITY): Payer: Self-pay

## 2016-12-11 ENCOUNTER — Emergency Department (HOSPITAL_COMMUNITY)
Admission: EM | Admit: 2016-12-11 | Discharge: 2016-12-11 | Disposition: A | Discharge status: Home / Self Care | Payer: BC Managed Care – PPO | Attending: Emergency Medicine | Admitting: Emergency Medicine

## 2016-12-11 DIAGNOSIS — H6690 Otitis media, unspecified, unspecified ear: Secondary | ICD-10-CM

## 2016-12-11 DIAGNOSIS — H6592 Unspecified nonsuppurative otitis media, left ear: Secondary | ICD-10-CM | POA: Insufficient documentation

## 2016-12-11 DIAGNOSIS — M542 Cervicalgia: Secondary | ICD-10-CM | POA: Insufficient documentation

## 2016-12-11 DIAGNOSIS — Z79899 Other long term (current) drug therapy: Secondary | ICD-10-CM | POA: Insufficient documentation

## 2016-12-11 MED ORDER — AMOXICILLIN 500 MG PO CAPS: 500.0000 mg | ORAL_CAPSULE | Freq: Two times a day (BID) | ORAL | 0 refills | Status: AC

## 2016-12-11 MED ORDER — METHOCARBAMOL 500 MG PO TABS: 500.0000 mg | ORAL_TABLET | Freq: Two times a day (BID) | ORAL | 0 refills | Status: DC

## 2016-12-11 MED ORDER — AMOXICILLIN 500 MG PO CAPS: 500.0000 mg | ORAL_CAPSULE | Freq: Three times a day (TID) | ORAL | 0 refills | Status: DC

## 2016-12-11 NOTE — Discharge Instructions (Signed)
Please read attached information regarding your condition. Take amoxicillin twice daily for  7 days. Use Robaxin and Tylenol as directed. Stretch and apply heat to area as tolerated. Return to ED for worsening pain, trouble moving neck ,fevers, trouble swallowing, shortness of breath.

## 2016-12-11 NOTE — ED Triage Notes (Signed)
Pt presents with c/o left neck pain and left ear pain that started a few days ago. Pt denies any current injury. Pt ambulatory to triage.

## 2016-12-11 NOTE — ED Provider Notes (Signed)
[facility]-EMERGENCY DEPT Provider Note   CSN: [id number] Arrival date & time: 12/11/16  1847     History   Chief Complaint Chief Complaint  Patient presents with  . Neck Pain  . Otalgia    HPI Jillian [name] is a 41 y.o. female.  HPI  Patient presents to the ED for 2 day history of left sided neck pain and left ear pain that started suddenly. She states that the pain is worse with movement and palpation. She denies any fevers, trouble swallowing, trouble breathing, sore throat. enies any decreased range of motion of neck.She has tried taking Tylenol and Flexeril with mild relief of her symptoms. She denies any injury or falls that brought the pain on. She denies any previous neck fracture or procedure. She denies any discharge from the ear.  History reviewed. No pertinent past medical history.  Patient Active Problem List   Diagnosis Date Noted  . Generalized anxiety disorder 01/06/2015  . Esophageal reflux 01/06/2015    Past Surgical History:  Procedure Laterality Date  . CESAREAN SECTION    . LAPAROSCOPIC GASTRIC SLEEVE RESECTION      OB History    No data available       Home Medications    Prior to Admission medications   Medication Sig Start Date End Date Taking? Authorizing Provider  amoxicillin (AMOXIL) 500 MG capsule Take 1 capsule (500 mg total) by mouth 2 (two) times daily. 12/11/16 12/18/16  Isyss Espinal, PA-C  cetirizine (ZYRTEC) 10 MG tablet Take 1 tablet (10 mg total) by mouth daily. Patient not taking: Reported on 05/15/2016 04/19/16   Doristine BosworthStallings, Zoe A, MD  cyclobenzaprine (FLEXERIL) 10 MG tablet Take 0.5-1 tablets (5-10 mg total) by mouth 3 (three) times daily as needed for muscle spasms. 08/27/16   Trena PlattEnglish, Stephanie D, PA  escitalopram (LEXAPRO) 10 MG tablet TAKE 1 TABLET BY MOUTH DAILY. 08/27/16   Trena PlattEnglish, Stephanie D, PA  fluticasone (FLONASE) 50 MCG/ACT nasal spray Place 2 sprays into both nostrils daily. Patient not taking: Reported on 05/15/2016  04/19/16   Doristine BosworthStallings, Zoe A, MD  ibuprofen (ADVIL,MOTRIN) 800 MG tablet Take 800 mg by mouth every 8 (eight) hours as needed for headache, mild pain or moderate pain.  05/12/16   [provider]  meloxicam (MOBIC) 7.5 MG tablet TAKE 1 TABLET (7.5 MG TOTAL) BY MOUTH DAILY. 09/30/16   Trena PlattEnglish, Stephanie D, PA  methocarbamol (ROBAXIN) 500 MG tablet Take 1 tablet (500 mg total) by mouth 2 (two) times daily. 12/11/16   Ronelle Michie, PA-C  pantoprazole (PROTONIX) 40 MG tablet TAKE 1 TABLET (40 MG TOTAL) BY MOUTH DAILY. 08/27/16   Trena PlattEnglish, Stephanie D, PA  ranitidine (ZANTAC) 150 MG tablet TAKE 1 TABLET BY MOUTH TWICE A DAY 10/24/16   Trena PlattEnglish, Stephanie D, PA    Family History Family History  Problem Relation Age of Onset  . Diabetes Mother   . Hyperlipidemia Mother   . Hypertension Mother   . Hyperlipidemia Sister   . Diabetes Maternal Grandfather   . Heart disease Paternal Grandmother     Social History Social History  Substance Use Topics  . Smoking status: Never Smoker  . Smokeless tobacco: Never Used  . Alcohol use 0.0 oz/week     Comment: socially     Allergies   Patient has no known allergies.   Review of Systems Review of Systems  Constitutional: Negative for appetite change, chills and fever.  HENT: Positive for ear pain. Negative for ear discharge, facial swelling,  hearing loss, rhinorrhea, sneezing and sore throat.   Eyes: Negative for photophobia and visual disturbance.  Respiratory: Negative for cough, chest tightness, shortness of breath and wheezing.   Cardiovascular: Negative for chest pain and palpitations.  Musculoskeletal: Positive for neck pain. Negative for myalgias.  Skin: Negative for rash.  Neurological: Negative for dizziness, weakness and light-headedness.     Physical Exam Updated Vital Signs BP 124/75 (BP Location: Right Arm)   Pulse 90   Temp 98.2 F (36.8 C) (Oral)   Resp 18   Ht 5\' 8"  (1.727 m)   Wt 90.7 kg (200 lb)   SpO2 100%   BMI  30.41 kg/m   Physical Exam  Constitutional: She appears well-developed and well-nourished. No distress.  HENT:  Head: Normocephalic and atraumatic.  Right Ear: Tympanic membrane is erythematous and retracted.  Left Ear: Tympanic membrane, external ear and ear canal normal.  Eyes: Conjunctivae and EOM are normal. No scleral icterus.  Neck: Normal range of motion. Neck supple.    Tenderness to palpation at the indicated area. No changes in voice. No edema or color change noted.Patient does not appear to be in acute distress. No trismus or drooling present. No pooling of secretions. Patient is tolerating secretions and is not in respiratorydistress. Full active and passive range of motion of the neck. No evidence of RPA or PTA.   Cardiovascular: Normal rate and regular rhythm.   Pulmonary/Chest: Effort normal and breath sounds normal. No respiratory distress.  Lymphadenopathy:    She has no cervical adenopathy.  Neurological: She is alert. No cranial nerve deficit or sensory deficit. She exhibits normal muscle tone. Coordination normal.  Skin: No rash noted. She is not diaphoretic.  Psychiatric: She has a normal mood and affect.  Nursing note and vitals reviewed.    ED Treatments / Results  Labs (all labs ordered are listed, but only abnormal results are displayed) Labs Reviewed - No data to display  EKG  EKG Interpretation None       Radiology No results found.  Procedures Procedures (including critical care time)  Medications Ordered in ED Medications - No data to display   Initial Impression / Assessment and Plan / ED Course  I have reviewed the triage vital signs and the nursing notes.  Pertinent labs & imaging results that were available during my care of the patient were reviewed by me and considered in my medical decision making (see chart for details).     Patient presents to ED for 2 day history of neck pain on the left side and left-sided ear pain. She  reports worsening of pain with palpation. She denies any sore throat, trouble swallowing, trouble breathing, swelling or color temperature changes. On physical exam she does have tenderness to palpation over the musculature on the left side of the neck. There is no visible swelling, trismus, drooling noted. She is not in respiratory distress. I have low suspicion for RPA or PTA based on her symptoms. Mouth and throat appear normal with no concern for strep throat or other infection. She is afebrile with no history of fever. Right TM appears erythematous and dull. Left TM normal. No focal findings on neurological exam. I suspect that her neck pain could be due to a muscle strain considering that she has some relief with the Tylenol and muscle relaxer. I encouraged her to continue taking his medications but to also apply heat to area and stretch as tolerated. Will give antibiotics for treatment of right acute  otitis media and advise patient to follow up with PCP for further evaluation. Should return precautions given.  Final Clinical Impressions(s) / ED Diagnoses   Final diagnoses:  Neck pain  Acute otitis media, unspecified otitis media type    New Prescriptions Discharge Medication List as of 12/11/2016  7:38 PM    START taking these medications   Details  methocarbamol (ROBAXIN) 500 MG tablet Take 1 tablet (500 mg total) by mouth 2 (two) times daily., Starting Sat 12/11/2016, Print         Dietrich Pates, [place] 12/11/16 2109    Lorre Nick, MD 12/11/16 2318

## 2017-01-23 ENCOUNTER — Other Ambulatory Visit: Payer: Self-pay | Admitting: Physician Assistant

## 2017-01-23 DIAGNOSIS — K219 Gastro-esophageal reflux disease without esophagitis: Secondary | ICD-10-CM

## 2017-03-18 ENCOUNTER — Other Ambulatory Visit: Payer: Self-pay | Admitting: Physician Assistant

## 2017-03-18 DIAGNOSIS — K219 Gastro-esophageal reflux disease without esophagitis: Secondary | ICD-10-CM

## 2017-04-06 ENCOUNTER — Other Ambulatory Visit: Payer: Self-pay | Admitting: Physician Assistant

## 2017-04-06 DIAGNOSIS — K219 Gastro-esophageal reflux disease without esophagitis: Secondary | ICD-10-CM

## 2017-05-17 ENCOUNTER — Other Ambulatory Visit: Payer: Self-pay | Admitting: Physician Assistant

## 2017-05-17 DIAGNOSIS — K219 Gastro-esophageal reflux disease without esophagitis: Secondary | ICD-10-CM

## 2017-06-30 ENCOUNTER — Other Ambulatory Visit: Payer: Self-pay | Admitting: Physician Assistant

## 2017-06-30 DIAGNOSIS — K219 Gastro-esophageal reflux disease without esophagitis: Secondary | ICD-10-CM

## 2017-06-30 NOTE — Telephone Encounter (Signed)
Contacted pt regarding refill request; office visit need for additional refills; pt offered and accepted appointment with Trena PlattStephanie English [place] [address] at 0820 on 07/02/17; pt verbalizes understanding.

## 2017-07-02 ENCOUNTER — Encounter: Payer: Self-pay | Admitting: Physician Assistant

## 2017-07-02 ENCOUNTER — Other Ambulatory Visit: Payer: Self-pay

## 2017-07-02 ENCOUNTER — Ambulatory Visit (INDEPENDENT_AMBULATORY_CARE_PROVIDER_SITE_OTHER): Payer: BC Managed Care – PPO | Admitting: Physician Assistant

## 2017-07-02 VITALS — BP 90/57 | HR 76 | Temp 98.0°F | Resp 16 | Ht 68.0 in | Wt 207.0 lb

## 2017-07-02 DIAGNOSIS — Z1321 Encounter for screening for nutritional disorder: Secondary | ICD-10-CM | POA: Diagnosis not present

## 2017-07-02 DIAGNOSIS — Z13228 Encounter for screening for other metabolic disorders: Secondary | ICD-10-CM

## 2017-07-02 DIAGNOSIS — Z130 Encounter for screening for diseases of the blood and blood-forming organs and certain disorders involving the immune mechanism: Secondary | ICD-10-CM | POA: Diagnosis not present

## 2017-07-02 DIAGNOSIS — K219 Gastro-esophageal reflux disease without esophagitis: Secondary | ICD-10-CM

## 2017-07-02 DIAGNOSIS — Z23 Encounter for immunization: Secondary | ICD-10-CM | POA: Diagnosis not present

## 2017-07-02 DIAGNOSIS — Z1329 Encounter for screening for other suspected endocrine disorder: Secondary | ICD-10-CM

## 2017-07-02 DIAGNOSIS — Z13820 Encounter for screening for osteoporosis: Secondary | ICD-10-CM | POA: Diagnosis not present

## 2017-07-02 DIAGNOSIS — Z13220 Encounter for screening for lipoid disorders: Secondary | ICD-10-CM | POA: Diagnosis not present

## 2017-07-02 DIAGNOSIS — Z0000 Encounter for general adult medical examination without abnormal findings: Secondary | ICD-10-CM | POA: Diagnosis not present

## 2017-07-02 MED ORDER — RANITIDINE HCL 150 MG PO TABS: 150.0000 mg | ORAL_TABLET | Freq: Two times a day (BID) | ORAL | 3 refills | Status: DC

## 2017-07-02 NOTE — Progress Notes (Signed)
PRIMARY CARE AT [place], [place] [address] [phone]  Date:  07/02/2017   Name:  Jillian [name]   DOB:  1975/12/29   MRN:  [id number]  PCP:  Patient, No Pcp Per    History of Present Illness:  Jillian [name] is a 42 y.o. female patient who presents to PCP with  Chief Complaint  Patient presents with  . Annual Exam    No pap, needs med refill of ranitidine      DIET: "some healthy and some not so healthy".  Eating protein and vegetables which she gets daily.  She is also eating junk food--chips, cookies not daily.  She thinks that she is stress eating.    BM: normal.  No black or bloody stool.  No constipation or diarrhea   URINATION: normal.  No dysuria, hematuria, or frequency  SLEEP: she may take a sleeping aid otc.  It does help.    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  8 hours per day. Exercise: none at this time.  School--going well.  She will not finish until 2020.  EtOH: social Tobacco or vaping: none Illicit drug use: none  Patient Active Problem List   Diagnosis Date Noted  . Generalized anxiety disorder 01/06/2015  . Esophageal reflux 01/06/2015    No past medical history on file.  Past Surgical History:  Procedure Laterality Date  . CESAREAN SECTION    . LAPAROSCOPIC GASTRIC SLEEVE RESECTION      Social History   Tobacco Use  . Smoking status: Never Smoker  . Smokeless tobacco: Never Used  Substance Use Topics  . Alcohol use: Yes    Alcohol/week: 0.0 oz    Comment: socially  . Drug use: No    Family History  Problem Relation Age of Onset  . Diabetes Mother   . Hyperlipidemia Mother   . Hypertension Mother   . Hyperlipidemia Sister   . Diabetes Maternal Grandfather   . Heart disease Paternal Grandmother     No Known Allergies  Medication list has been reviewed and updated.  Current Outpatient Medications on File Prior to Visit  Medication Sig Dispense Refill  . escitalopram (LEXAPRO) 10 MG  tablet TAKE 1 TABLET BY MOUTH DAILY. 90 tablet 3  . pantoprazole (PROTONIX) 40 MG tablet TAKE 1 TABLET (40 MG TOTAL) BY MOUTH DAILY. 90 tablet 3  . ranitidine (ZANTAC) 150 MG tablet Take 1 tablet (150 mg total) by mouth 2 (two) times daily. Office visit needed 60 tablet 0  . cetirizine (ZYRTEC) 10 MG tablet Take 1 tablet (10 mg total) by mouth daily. (Patient not taking: Reported on 05/15/2016) 30 tablet 11  . cyclobenzaprine (FLEXERIL) 10 MG tablet Take 0.5-1 tablets (5-10 mg total) by mouth 3 (three) times daily as needed for muscle spasms. (Patient not taking: Reported on 07/02/2017) 30 tablet 0  . fluticasone (FLONASE) 50 MCG/ACT nasal spray Place 2 sprays into both nostrils daily. (Patient not taking: Reported on 05/15/2016) 16 g 6  . ibuprofen (ADVIL,MOTRIN) 800 MG tablet Take 800 mg by mouth every 8 (eight) hours as needed for headache, mild pain or moderate pain.     . meloxicam (MOBIC) 7.5 MG tablet TAKE 1 TABLET (7.5 MG TOTAL) BY MOUTH DAILY. (Patient not taking: Reported on 07/02/2017) 30 tablet 0  . methocarbamol (ROBAXIN) 500 MG tablet Take 1 tablet (500 mg total) by mouth 2 (two) times daily. (Patient not taking: Reported on 07/02/2017) 20 tablet 0   No current  facility-administered medications on file prior to visit.     Review of Systems  Constitutional: Negative for chills and fever.  HENT: Negative for ear discharge, ear pain and sore throat.   Eyes: Negative for blurred vision and double vision.  Respiratory: Negative for cough, shortness of breath and wheezing.   Cardiovascular: Negative for chest pain, palpitations and leg swelling.  Gastrointestinal: Negative for diarrhea, nausea and vomiting.  Genitourinary: Negative for dysuria, frequency and hematuria.  Skin: Negative for itching and rash.  Neurological: Negative for dizziness and headaches.   ROS otherwise unremarkable unless listed above.  Physical Examination: BP (!) 90/57   Pulse 76   Temp 98 F (36.7 C)   Resp  16   Ht 5' 8"  (1.727 m)   Wt 207 lb (93.9 kg)   LMP  (LMP Unknown) Comment: LMP months ago- perimenopausal  SpO2 100%   BMI 31.47 kg/m  Ideal Body Weight: Weight in (lb) to have BMI = 25: 164.1  Physical Exam  Constitutional: She is oriented to person, place, and time. She appears well-developed and well-nourished. No distress.  HENT:  Head: Normocephalic and atraumatic.  Right Ear: Tympanic membrane, external ear and ear canal normal.  Left Ear: Tympanic membrane, external ear and ear canal normal.  Nose: Right sinus exhibits no maxillary sinus tenderness and no frontal sinus tenderness. Left sinus exhibits no maxillary sinus tenderness and no frontal sinus tenderness.  Mouth/Throat: Oropharynx is clear and moist. No uvula swelling. No oropharyngeal exudate, posterior oropharyngeal edema or posterior oropharyngeal erythema.  Eyes: Conjunctivae and EOM are normal. Pupils are equal, round, and reactive to light.  Neck: Normal range of motion. Neck supple. No thyromegaly present.  Cardiovascular: Normal rate, regular rhythm, normal heart sounds and intact distal pulses. Exam reveals no gallop, no distant heart sounds and no friction rub.  No murmur heard. Pulmonary/Chest: Effort normal and breath sounds normal. No respiratory distress. She has no decreased breath sounds. She has no wheezes. She has no rhonchi.  Abdominal: Soft. Bowel sounds are normal. She exhibits no distension and no mass. There is no tenderness.  Musculoskeletal: Normal range of motion. She exhibits no edema or tenderness.  Lymphadenopathy:       Head (right side): No submandibular, no tonsillar, no preauricular and no posterior auricular adenopathy present.       Head (left side): No submandibular, no tonsillar, no preauricular and no posterior auricular adenopathy present.    She has no cervical adenopathy.  Neurological: She is alert and oriented to person, place, and time. No cranial nerve deficit. She exhibits  normal muscle tone. Coordination normal.  Skin: Skin is warm and dry. She is not diaphoretic.  Psychiatric: She has a normal mood and affect. Her behavior is normal.     Assessment and Plan: Jillian [name] is a 42 y.o. female who is here today for cc of  Chief Complaint  Patient presents with  . Annual Exam    No pap, needs med refill of ranitidine   Annual physical exam - Plan: CBC, CMP14+EGFR, TSH, Lipid panel  Screening for deficiency anemia - Plan: CBC  Screening for metabolic disorder - Plan: CMP14+EGFR  Screening for thyroid disorder - Plan: TSH  Screening for lipid disorders - Plan: Lipid panel  Encounter for vitamin deficiency screening - Plan: Vitamin B12, Vitamin D 1,25 dihydroxy  Need for prophylactic vaccination and inoculation against influenza - Plan: Flu Vaccine QUAD 6+ mos PF IM (Fluarix Quad PF)  Gastroesophageal reflux disease, esophagitis  presence not specified - Plan: ranitidine (ZANTAC) 150 MG tablet  Screening for osteoporosis - Plan: Vitamin D 1,25 dihydroxy  Ivar Drape, PA-C [facility] 2/10/20198:17 PM

## 2017-07-02 NOTE — Patient Instructions (Signed)

## 2017-07-08 LAB — CBC
HEMATOCRIT: 37.5 % (ref 34.0–46.6)
HEMOGLOBIN: 12.9 g/dL (ref 11.1–15.9)
MCH: 29.3 pg (ref 26.6–33.0)
MCHC: 34.4 g/dL (ref 31.5–35.7)
MCV: 85 fL (ref 79–97)
Platelets: 231 10*3/uL (ref 150–379)
RBC: 4.41 x10E6/uL (ref 3.77–5.28)
RDW: 13.8 % (ref 12.3–15.4)
WBC: 3.8 10*3/uL (ref 3.4–10.8)

## 2017-07-08 LAB — CMP14+EGFR
ALK PHOS: 58 IU/L (ref 39–117)
ALT: 14 IU/L (ref 0–32)
AST: 18 IU/L (ref 0–40)
Albumin/Globulin Ratio: 1.7 (ref 1.2–2.2)
Albumin: 4.2 g/dL (ref 3.5–5.5)
BUN/Creatinine Ratio: 18 (ref 9–23)
BUN: 14 mg/dL (ref 6–20)
Bilirubin Total: 0.9 mg/dL (ref 0.0–1.2)
CALCIUM: 9 mg/dL (ref 8.7–10.2)
CO2: 22 mmol/L (ref 20–29)
CREATININE: 0.78 mg/dL (ref 0.57–1.00)
Chloride: 105 mmol/L (ref 96–106)
GFR calc Af Amer: 111 mL/min/{1.73_m2} (ref >59)
GFR, EST NON AFRICAN AMERICAN: 96 mL/min/{1.73_m2} (ref >59)
GLUCOSE: 72 mg/dL (ref 65–99)
Globulin, Total: 2.5 g/dL (ref 1.5–4.5)
Potassium: 4.2 mmol/L (ref 3.5–5.2)
Sodium: 142 mmol/L (ref 134–144)
Total Protein: 6.7 g/dL (ref 6.0–8.5)

## 2017-07-08 LAB — VITAMIN D 1,25 DIHYDROXY
VITAMIN D 1, 25 (OH) TOTAL: 49 pg/mL
VITAMIN D3 1, 25 (OH): 48 pg/mL

## 2017-07-08 LAB — LIPID PANEL
CHOLESTEROL TOTAL: 194 mg/dL (ref 100–199)
Chol/HDL Ratio: 2.3 ratio (ref 0.0–4.4)
HDL: 83 mg/dL (ref >39)
LDL Calculated: 103 mg/dL — ABNORMAL HIGH (ref 0–99)
Triglycerides: 41 mg/dL (ref 0–149)
VLDL CHOLESTEROL CAL: 8 mg/dL (ref 5–40)

## 2017-07-08 LAB — TSH: TSH: 1.46 u[IU]/mL (ref 0.450–4.500)

## 2017-07-08 LAB — VITAMIN B12: VITAMIN B 12: 312 pg/mL (ref 232–1245)

## 2017-08-09 IMAGING — US US ABDOMEN LIMITED
1 series · 14 of 25 positions shown · non-contrast
Comparison: None.

CLINICAL DATA: Epigastric pain, right upper quadrant pain

EXAM:
US ABDOMEN LIMITED - RIGHT UPPER QUADRANT

[Series 1: us abdomen limited · 0.26mm/px · 14 of 35 slices shown]
[im 1/35]
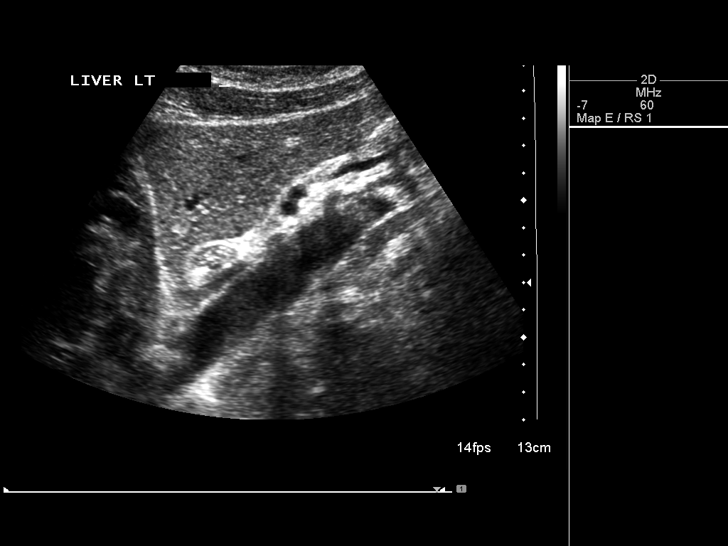
[im 3/35]
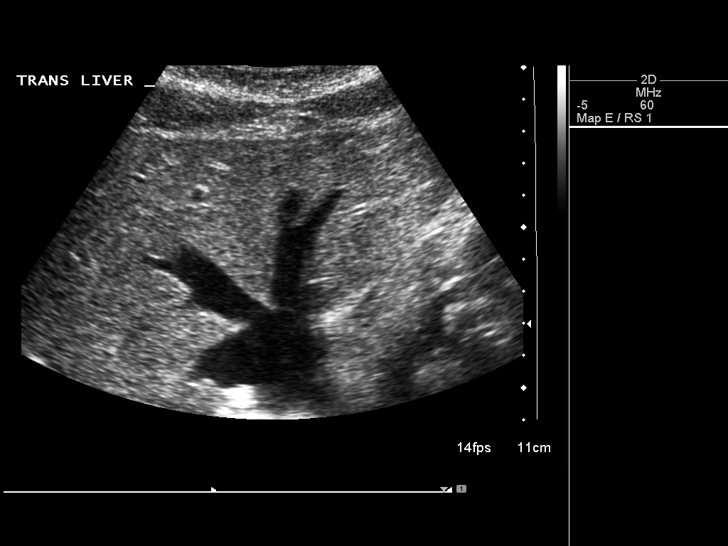
[im 6/35]
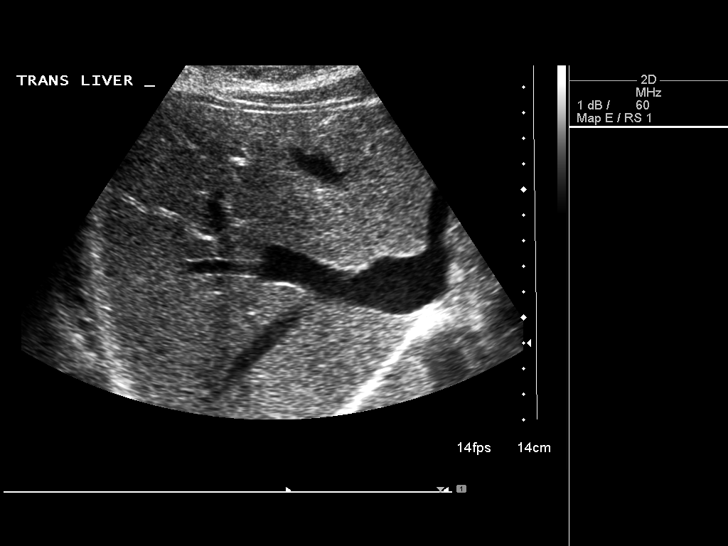
[im 9/35]
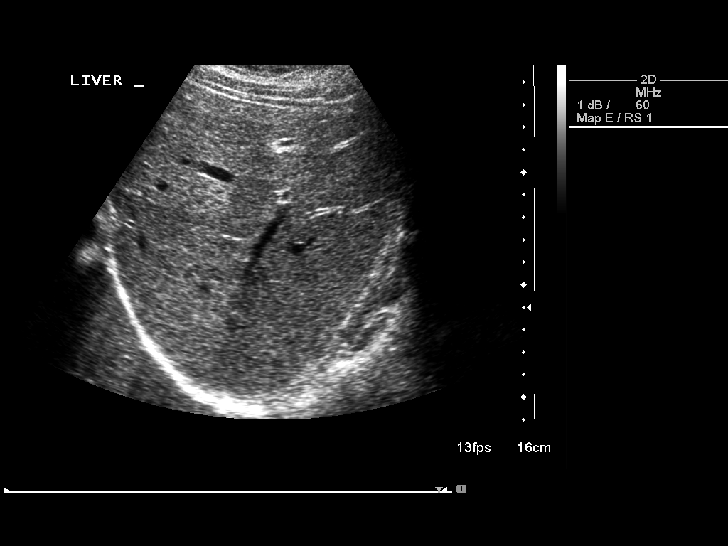
[im 12/35]
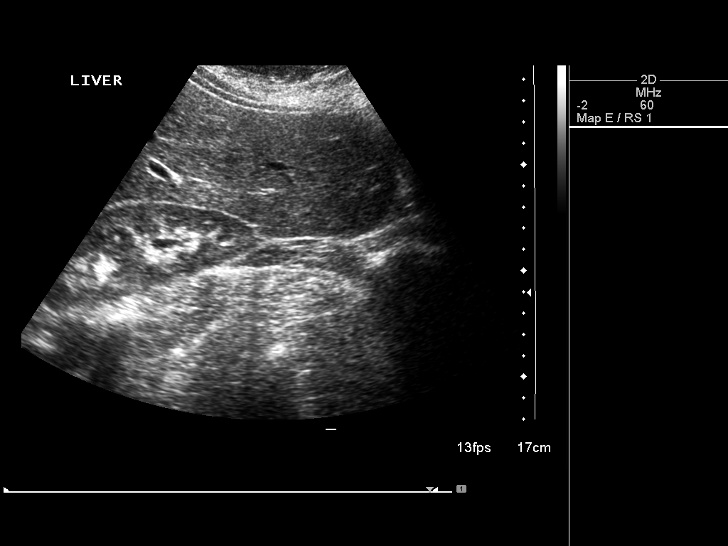
[im 13/35]
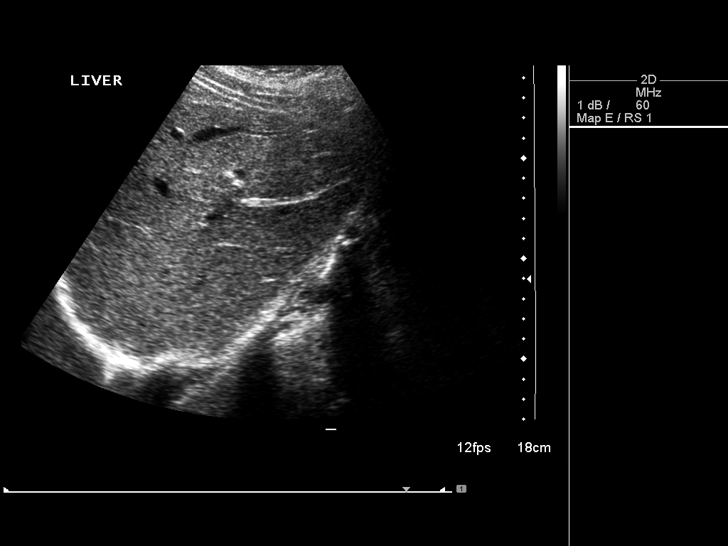
[im 16/35]
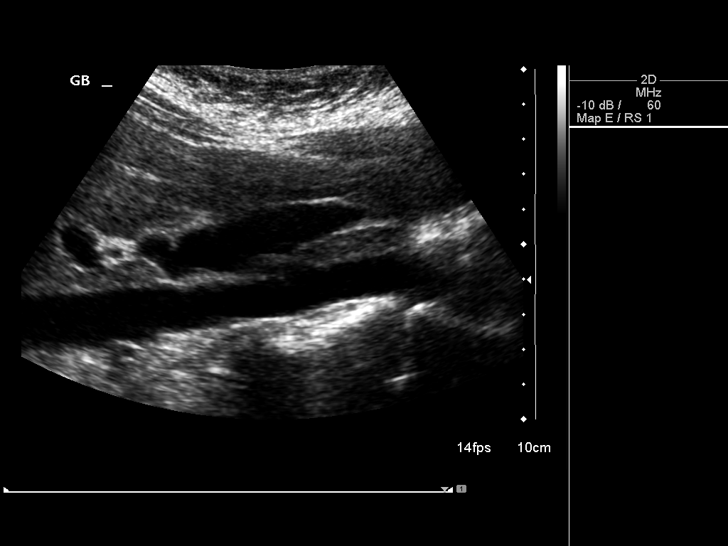
[im 19/35]
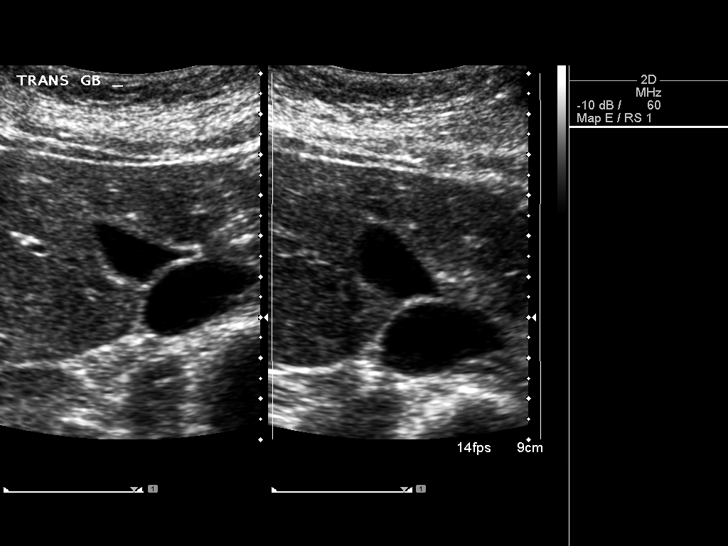
[im 22/35]
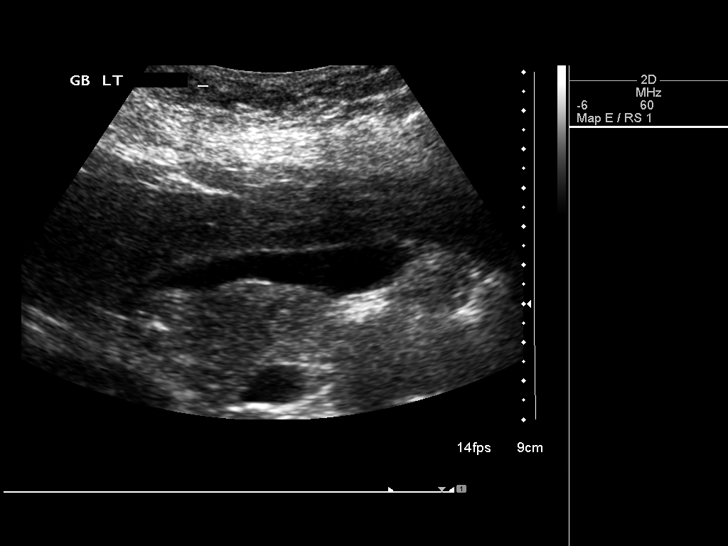
[im 23/35]
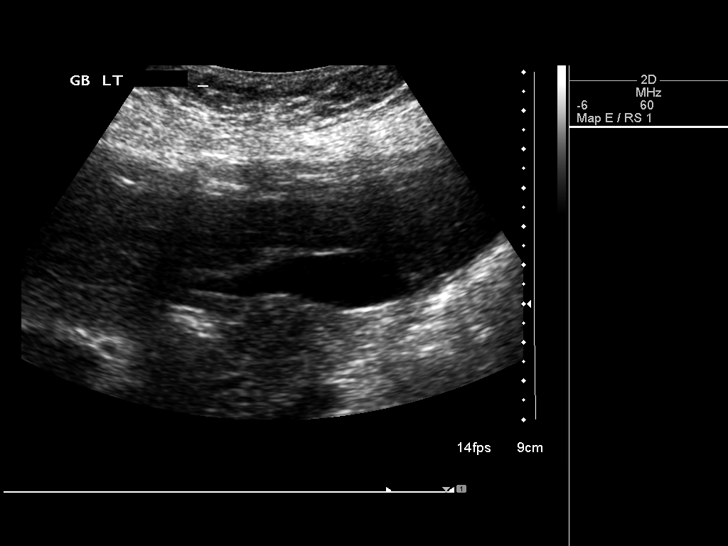
[im 26/35]
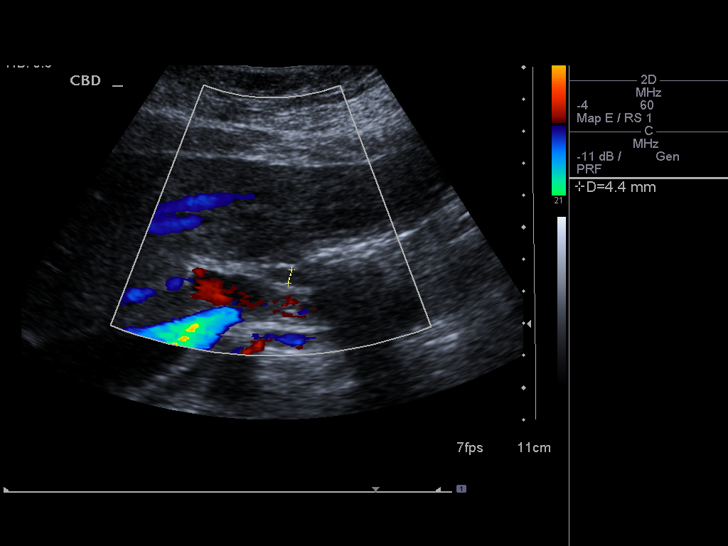
[im 29/35]
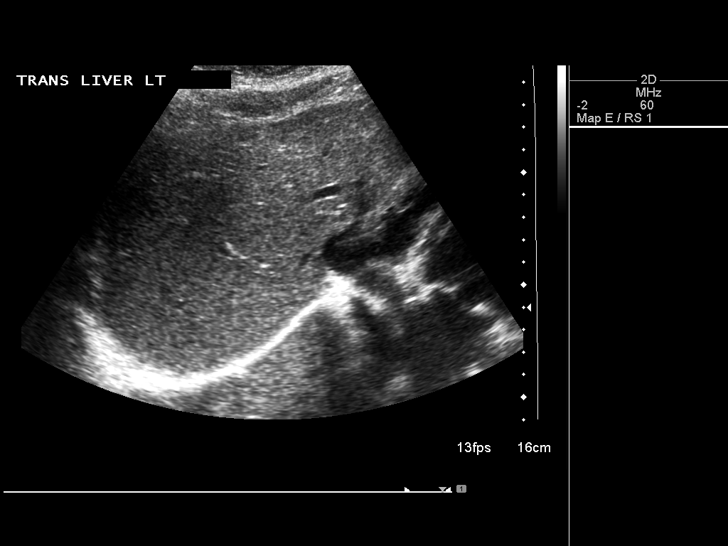
[im 32/35]
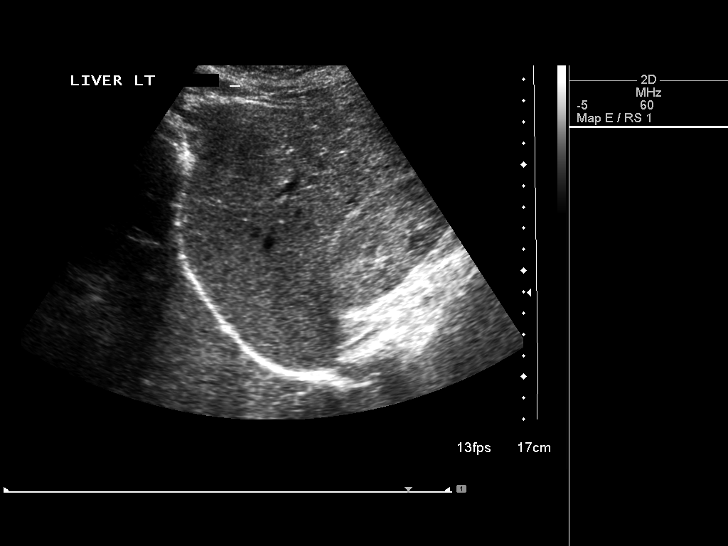
[im 35/35]
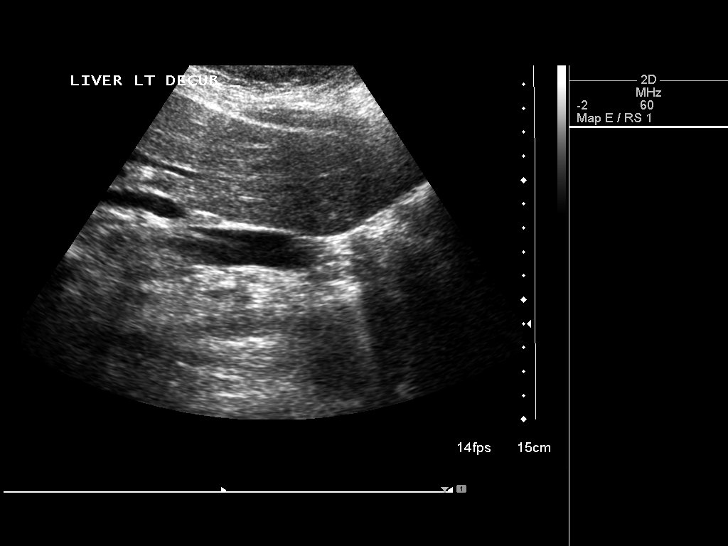

[14 of 25 positions shown; findings below may reference images not displayed]

FINDINGS: Gallbladder:

No gallstones or wall thickening visualized. No sonographic Murphy
sign noted.

Common bile duct:

Diameter: 4.4 mm in diameter within normal limits.

Liver:

No focal lesion identified. Within normal limits in parenchymal
echogenicity.
IMPRESSION: Normal right upper quadrant ultrasound.

## 2017-08-16 ENCOUNTER — Encounter: Payer: Self-pay | Admitting: Urgent Care

## 2017-08-16 ENCOUNTER — Ambulatory Visit: Payer: BC Managed Care – PPO | Admitting: Urgent Care

## 2017-08-16 VITALS — BP 110/74 | HR 65 | Temp 98.3°F | Resp 16 | Ht 68.0 in | Wt 209.4 lb

## 2017-08-16 DIAGNOSIS — R067 Sneezing: Secondary | ICD-10-CM | POA: Diagnosis not present

## 2017-08-16 DIAGNOSIS — H9202 Otalgia, left ear: Secondary | ICD-10-CM

## 2017-08-16 DIAGNOSIS — J029 Acute pharyngitis, unspecified: Secondary | ICD-10-CM

## 2017-08-16 LAB — POCT RAPID STREP A (OFFICE): RAPID STREP A SCREEN: NEGATIVE

## 2017-08-16 NOTE — Progress Notes (Signed)
   MRN: [id number] DOB: 09-02-1975  Subjective:   Jillian [name] is a 42 y.o. female presenting for 3 day history of sore throat, left sided neck discomfort, left ear pain. Denies fever, sinus pain, sinus congestion, ear drainage, cough, chest pain, shob, wheezing, n/v, abdominal pain. Has tried NyQuil without any significant difference. Denies smoking cigarettes.  Jillian [name] has a current medication list which includes the following prescription(s): escitalopram, pantoprazole, and ranitidine. Also has No Known Allergies.  Jillian [name] denies past medical history. Also  has a past surgical history that includes Cesarean section and Laparoscopic gastric sleeve resection.  Objective:   Vitals: BP 110/74   Pulse 65   Temp 98.3 F (36.8 C) (Oral)   Resp 16   Ht 5\' 8"  (1.727 m)   Wt 209 lb 6.4 oz (95 kg)   SpO2 100%   BMI 31.84 kg/m   Physical Exam  Constitutional: She is oriented to person, place, and time. She appears well-developed and well-nourished.  HENT:  Right Ear: Tympanic membrane normal.  Left Ear: Tympanic membrane normal.  Nose: No sinus tenderness.  Mouth/Throat: No oropharyngeal exudate, posterior oropharyngeal edema or posterior oropharyngeal erythema.  Eyes: Right eye exhibits no discharge. Left eye exhibits no discharge.  Neck: Normal range of motion. Neck supple.  Cardiovascular: Normal rate, regular rhythm and intact distal pulses. Exam reveals no gallop and no friction rub.  No murmur heard. Pulmonary/Chest: No respiratory distress. She has no wheezes. She has no rales.  Lymphadenopathy:    She has no cervical adenopathy.  Neurological: She is alert and oriented to person, place, and time.  Skin: Skin is warm and dry.  Psychiatric: She has a normal mood and affect.   Results for orders placed or performed in visit on 08/16/17 (from the past 24 hour(s))  POCT rapid strep A     Status: None   Collection Time: 08/16/17  8:52 AM  Result Value Ref Range   Rapid Strep A  Screen Negative Negative   Assessment and Plan :   Sore throat - Plan: POCT rapid strep A, Culture, Group A Strep  Sneezing  Left ear pain  Start supportive care for viral illness. Strep culture pending. Return-to-clinic precautions discussed, patient verbalized understanding.  Wallis BambergMario Dina Warbington, PA-C Primary [facility] at [facility] [phone] 08/16/2017  8:52 AM

## 2017-08-16 NOTE — Patient Instructions (Addendum)
Hydrate well with at least 2 liters (1 gallon) of water daily. For sore throat try using a honey-based tea. Use 3 teaspoons of honey with juice squeezed from half lemon. Place shaved pieces of ginger into 1/2-1 cup of water and warm over stove top. Then mix the ingredients and repeat every 4 hours as needed. You may take 500mg  Tylenol with ibuprofen 400-600mg  every 6 hours for pain and inflammation.      Sore Throat When you have a sore throat, your throat may:  Hurt.  Burn.  Feel irritated.  Feel scratchy.  Many things can cause a sore throat, including:  An infection.  Allergies.  Dryness in the air.  Smoke or pollution.  Gastroesophageal reflux disease (GERD).  A tumor.  A sore throat can be the first sign of another sickness. It can happen with other problems, like coughing or a fever. Most sore throats go away without treatment. Follow these instructions at home:  Take over-the-counter medicines only as told by your doctor.  Drink enough fluids to keep your pee (urine) clear or pale yellow.  Rest when you feel you need to.  To help with pain, try: ? Sipping warm liquids, such as broth, herbal tea, or warm water. ? Eating or drinking cold or frozen liquids, such as frozen ice pops. ? Gargling with a salt-water mixture 3-4 times a day or as needed. To make a salt-water mixture, add -1 tsp of salt in 1 cup of warm water. Mix it until you cannot see the salt anymore. ? Sucking on hard candy or throat lozenges. ? Putting a cool-mist humidifier in your bedroom at night. ? Sitting in the bathroom with the door closed for 5-10 minutes while you run hot water in the shower.  Do not use any tobacco products, such as cigarettes, chewing tobacco, and e-cigarettes. If you need help quitting, ask your doctor. Contact a doctor if:  You have a fever for more than 2-3 days.  You keep having symptoms for more than 2-3 days.  Your throat does not get better in 7 days.  You  have a fever and your symptoms suddenly get worse. Get help right away if:  You have trouble breathing.  You cannot swallow fluids, soft foods, or your saliva.  You have swelling in your throat or neck that gets worse.  You keep feeling like you are going to throw up (vomit).  You keep throwing up. This information is not intended to replace advice given to you by your health care provider. Make sure you discuss any questions you have with your health care provider. Document Released: 02/24/2008 Document Revised: 01/11/2016 Document Reviewed: 03/07/2015 Elsevier Interactive Patient Education  2018 [employer]     IF you received an x-ray today, you will receive an invoice from [facility]. Please contact [facility] at [phone] with questions or concerns regarding your invoice.   IF you received labwork today, you will receive an invoice from [facility]. Please contact [facility] at [phone] with questions or concerns regarding your invoice.   Our billing staff will not be able to assist you with questions regarding bills from these companies.  You will be contacted with the lab results as soon as they are available. The fastest way to get your results is to activate your My Chart account. Instructions are located on the last page of this paperwork. If you have not heard from us regarding the results in 2 weeks, please contact this office.

## 2017-08-18 LAB — CULTURE, GROUP A STREP: STREP A CULTURE: NEGATIVE

## 2017-09-07 ENCOUNTER — Encounter: Payer: Self-pay | Admitting: Physician Assistant

## 2017-09-10 ENCOUNTER — Ambulatory Visit: Payer: BC Managed Care – PPO | Admitting: Physician Assistant

## 2017-09-10 ENCOUNTER — Encounter: Payer: Self-pay | Admitting: Physician Assistant

## 2017-09-10 ENCOUNTER — Other Ambulatory Visit: Payer: Self-pay

## 2017-09-10 VITALS — BP 112/64 | HR 62 | Temp 98.2°F | Resp 18 | Ht 68.0 in | Wt 216.0 lb

## 2017-09-10 DIAGNOSIS — J301 Allergic rhinitis due to pollen: Secondary | ICD-10-CM

## 2017-09-10 DIAGNOSIS — R635 Abnormal weight gain: Secondary | ICD-10-CM

## 2017-09-10 DIAGNOSIS — R109 Unspecified abdominal pain: Secondary | ICD-10-CM | POA: Diagnosis not present

## 2017-09-10 DIAGNOSIS — R350 Frequency of micturition: Secondary | ICD-10-CM | POA: Diagnosis not present

## 2017-09-10 LAB — POC MICROSCOPIC URINALYSIS (UMFC): Mucus: ABSENT

## 2017-09-10 LAB — POCT URINALYSIS DIP (MANUAL ENTRY)
BILIRUBIN UA: NEGATIVE mg/dL
Bilirubin, UA: NEGATIVE
Blood, UA: NEGATIVE
Glucose, UA: NEGATIVE mg/dL
LEUKOCYTES UA: NEGATIVE
NITRITE UA: NEGATIVE
Protein Ur, POC: NEGATIVE mg/dL
Spec Grav, UA: 1.025 (ref 1.010–1.025)
Urobilinogen, UA: 0.2 E.U./dL
pH, UA: 8 (ref 5.0–8.0)

## 2017-09-10 LAB — POCT URINE PREGNANCY: Preg Test, Ur: NEGATIVE

## 2017-09-10 MED ORDER — CIPROFLOXACIN HCL 500 MG PO TABS: 500.0000 mg | ORAL_TABLET | Freq: Two times a day (BID) | ORAL | 0 refills | Status: DC

## 2017-09-10 NOTE — Progress Notes (Signed)
Patient ID: Jillian RoundDiandra [name], female    DOB: Jun 27, 1975, 42 y.o.   MRN: [id number]  PCP: Patient, No Pcp Per  Chief Complaint  Patient presents with  . Abdominal Pain    x 2weeks   . Urinary Frequency    Subjective:   Presents for evaluation of abdominal pain and urinary frequency x 2 weeks.  Abdominal pain and urinary frequency x 2 weeks. Worsening since yesterday, now a sharp pain central lower abdomen. No burning. Has urgency. No hematuria. No atypical vaginal discharge. No fever, chills. No nausea, vomiting or diarrhea. No constipation. Daily soft BM. No straining. SOme low back pain, toard the RIGHT.  Not currently sexually active, not x 1 month. Perimenopausal, no menstrual period in months, but not quite 12 months.  Also experiencing headache, sneezing, scratchy throat. Benadryl at night without much benefit.   Review of Systems As above    Patient Active Problem List   Diagnosis Date Noted  . Generalized anxiety disorder 01/06/2015  . Esophageal reflux 01/06/2015     Prior to Admission medications   Medication Sig Start Date End Date Taking? Authorizing Provider  escitalopram (LEXAPRO) 10 MG tablet TAKE 1 TABLET BY MOUTH DAILY. 08/27/16  Yes English, Judeth CornfieldStephanie D, PA  pantoprazole (PROTONIX) 40 MG tablet TAKE 1 TABLET (40 MG TOTAL) BY MOUTH DAILY. 08/27/16  Yes English, Judeth CornfieldStephanie D, PA  ranitidine (ZANTAC) 150 MG tablet Take 1 tablet (150 mg total) by mouth 2 (two) times daily. Office visit needed 07/02/17  Yes English, [place] RiverStephanie D, PA     No Known Allergies     Objective:  Physical Exam  Constitutional: She is oriented to person, place, and time. She appears well-developed and well-nourished. She is active and cooperative. No distress.  BP 112/64   Pulse 62   Temp 98.2 F (36.8 C) (Oral)   Resp 18   Ht 5\' 8"  (1.727 m)   Wt 216 lb (98 kg)   SpO2 100%   BMI 32.84 kg/m   HENT:  Head: Normocephalic and atraumatic.  Right Ear: Hearing normal.    Left Ear: Hearing normal.  Eyes: Conjunctivae are normal. No scleral icterus.  Neck: Normal range of motion. Neck supple. No thyromegaly present.  Cardiovascular: Normal rate, regular rhythm and normal heart sounds.  Pulses:      Radial pulses are 2+ on the right side, and 2+ on the left side.  Pulmonary/Chest: Effort normal and breath sounds normal.  Abdominal: Bowel sounds are normal. She exhibits no shifting dullness, no distension, no pulsatile liver, no fluid wave, no abdominal bruit, no ascites, no pulsatile midline mass and no mass. There is no hepatosplenomegaly. There is generalized tenderness (mild). There is CVA tenderness (RIGHT, mild). There is no rigidity, no rebound, no guarding, no tenderness at McBurney's point and negative Murphy's sign.  Lymphadenopathy:       Head (right side): No tonsillar, no preauricular, no posterior auricular and no occipital adenopathy present.       Head (left side): No tonsillar, no preauricular, no posterior auricular and no occipital adenopathy present.    She has no cervical adenopathy.       Right: No supraclavicular adenopathy present.       Left: No supraclavicular adenopathy present.  Neurological: She is alert and oriented to person, place, and time. No sensory deficit.  Skin: Skin is warm, dry and intact. No rash noted. No cyanosis or erythema. Nails show no clubbing.  Psychiatric: She has a normal  mood and affect. Her speech is normal and behavior is normal.      Results for orders placed or performed in visit on 09/10/17  POCT urinalysis dipstick  Result Value Ref Range   Color, UA yellow yellow   Clarity, UA clear clear   Glucose, UA negative negative mg/dL   Bilirubin, UA negative negative   Ketones, POC UA negative negative mg/dL   Spec Grav, UA 1.610 9.604 - 1.025   Blood, UA negative negative   pH, UA 8.0 5.0 - 8.0   Protein Ur, POC negative negative mg/dL   Urobilinogen, UA 0.2 0.2 or 1.0 E.U./dL   Nitrite, UA Negative  Negative   Leukocytes, UA Negative Negative  POCT urine pregnancy  Result Value Ref Range   Preg Test, Ur Negative Negative  POCT Microscopic Urinalysis ([facility])  Result Value Ref Range   WBC,UR,HPF,POC None None WBC/hpf   RBC,UR,HPF,POC None None RBC/hpf   Bacteria None None, Too numerous to count   Mucus Absent Absent   Epithelial Cells, UR Per Microscopy Few (A) None, Too numerous to count cells/hpf   Wt Readings from Last 3 Encounters:  09/10/17 216 lb (98 kg)  08/16/17 209 lb 6.4 oz (95 kg)  07/02/17 207 lb (93.9 kg)        Assessment & Plan:   1. Abdominal pain, unspecified abdominal location 2. Urinary frequency COncern for early RIGHT sided pyelonephritis, given CVA tenderness. Cover as such and await UCx. If no better, re-evaluate with CBC and abdominal imaging. - POCT urinalysis dipstick - POCT urine pregnancy - POCT Microscopic Urinalysis ([facility]) - Urine Culture - ciprofloxacin (CIPRO) 500 MG tablet; Take 1 tablet (500 mg total) by mouth 2 (two) times daily.  Dispense: 10 tablet; Refill: 0  3. Seasonal allergic rhinitis due to pollen OTC non-sedating oral antihistamine daily, with Benadryl at HS PRN. OTC nasal steroid spray.  4. Weight gain Recommend keeping a food and exercise diary (My Fitness Pal app or paper and pencil) for review. TSH normal 2 months ago    Return if symptoms worsen or fail to improve.   Fernande Bras, PA-C Primary Care at [facility]

## 2017-09-10 NOTE — Patient Instructions (Addendum)
While we wait for the urine culture results, I recommend proceeding with treatment for possible infection. If your symptoms are not improving, it's important that you return for additional evaluation.  For the allergy symptoms, try an OTC oral antihistamine (Claritin, Allegra, Zyrtec) each morning. You can still use the Benadryl at bedtime, if needed. You may also need to start a steroid nasal spray, if the symptoms are persistent. You can purchase Flonase or Nasonex OTC. Know that these products can take 7-14 days to become effective.  For weight loss: Consider an app like My Fitness Pal, that allows you to log you eating and your exercise. These logs can be revealing, showing us where we can make changes that we would not have recognized. You can also simply write it all down. We would be happy to review the logs with you and make recommendations.    IF you received an x-ray today, you will receive an invoice from [facility]. Please contact [facility] at [phone] with questions or concerns regarding your invoice.   IF you received labwork today, you will receive an invoice from [facility]. Please contact [facility] at [phone] with questions or concerns regarding your invoice.   Our billing staff will not be able to assist you with questions regarding bills from these companies.  You will be contacted with the lab results as soon as they are available. The fastest way to get your results is to activate your My Chart account. Instructions are located on the last page of this paperwork. If you have not heard from us regarding the results in 2 weeks, please contact this office.     Calorie Counting for Weight Loss Calories are units of energy. Your body needs a certain amount of calories from food to keep you going throughout the day. When you eat more calories than your body needs, your body stores the extra calories as fat. When you eat fewer calories than your body  needs, your body burns fat to get the energy it needs. Calorie counting means keeping track of how many calories you eat and drink each day. Calorie counting can be helpful if you need to lose weight. If you make sure to eat fewer calories than your body needs, you should lose weight. Ask your health care provider what a healthy weight is for you. For calorie counting to work, you will need to eat the right number of calories in a day in order to lose a healthy amount of weight per week. A dietitian can help you determine how many calories you need in a day and will give you suggestions on how to reach your calorie goal.  A healthy amount of weight to lose per week is usually 1-2 lb (0.5-0.9 kg). This usually means that your daily calorie intake should be reduced by 500-750 calories.  Eating 1,200 - 1,500 calories per day can help most women lose weight.  Eating 1,500 - 1,800 calories per day can help most men lose weight.  What is my plan? My goal is to have __________ calories per day. If I have this many calories per day, I should lose around __________ pounds per week. What do I need to know about calorie counting? In order to meet your daily calorie goal, you will need to:  Find out how many calories are in each food you would like to eat. Try to do this before you eat.  Decide how much of the food you plan to eat.  Write  down what you ate and how many calories it had. Doing this is called keeping a food log.  To successfully lose weight, it is important to balance calorie counting with a healthy lifestyle that includes regular activity. Aim for 150 minutes of moderate exercise (such as walking) or 75 minutes of vigorous exercise (such as running) each week. Where do I find calorie information?  The number of calories in a food can be found on a Nutrition Facts label. If a food does not have a Nutrition Facts label, try to look up the calories online or ask your dietitian for  help. Remember that calories are listed per serving. If you choose to have more than one serving of a food, you will have to multiply the calories per serving by the amount of servings you plan to eat. For example, the label on a package of bread might say that a serving size is 1 slice and that there are 90 calories in a serving. If you eat 1 slice, you will have eaten 90 calories. If you eat 2 slices, you will have eaten 180 calories. How do I keep a food log? Immediately after each meal, record the following information in your food log:  What you ate. Don't forget to include toppings, sauces, and other extras on the food.  How much you ate. This can be measured in cups, ounces, or number of items.  How many calories each food and drink had.  The total number of calories in the meal.  Keep your food log near you, such as in a small notebook in your pocket, or use a mobile app or website. Some programs will calculate calories for you and show you how many calories you have left for the day to meet your goal. What are some calorie counting tips?  Use your calories on foods and drinks that will fill you up and not leave you hungry: ? Some examples of foods that fill you up are nuts and nut butters, vegetables, lean proteins, and high-fiber foods like whole grains. High-fiber foods are foods with more than 5 g fiber per serving. ? Drinks such as sodas, specialty coffee drinks, alcohol, and juices have a lot of calories, yet do not fill you up.  Eat nutritious foods and avoid empty calories. Empty calories are calories you get from foods or beverages that do not have many vitamins or protein, such as candy, sweets, and soda. It is better to have a nutritious high-calorie food (such as an avocado) than a food with few nutrients (such as a bag of chips).  Know how many calories are in the foods you eat most often. This will help you calculate calorie counts faster.  Pay attention to calories in  drinks. Low-calorie drinks include water and unsweetened drinks.  Pay attention to nutrition labels for "low fat" or "fat free" foods. These foods sometimes have the same amount of calories or more calories than the full fat versions. They also often have added sugar, starch, or salt, to make up for flavor that was removed with the fat.  Find a way of tracking calories that works for you. Get creative. Try different apps or programs if writing down calories does not work for you. What are some portion control tips?  Know how many calories are in a serving. This will help you know how many servings of a certain food you can have.  Use a measuring cup to measure serving sizes. You could also try  weighing out portions on a kitchen scale. With time, you will be able to estimate serving sizes for some foods.  Take some time to put servings of different foods on your favorite plates, bowls, and cups so you know what a serving looks like.  Try not to eat straight from a bag or box. Doing this can lead to overeating. Put the amount you would like to eat in a cup or on a plate to make sure you are eating the right portion.  Use smaller plates, glasses, and bowls to prevent overeating.  Try not to multitask (for example, watch TV or use your computer) while eating. If it is time to eat, sit down at a table and enjoy your food. This will help you to know when you are full. It will also help you to be aware of what you are eating and how much you are eating. What are tips for following this plan? Reading food labels  Check the calorie count compared to the serving size. The serving size may be smaller than what you are used to eating.  Check the source of the calories. Make sure the food you are eating is high in vitamins and protein and low in saturated and trans fats. Shopping  Read nutrition labels while you shop. This will help you make healthy decisions before you decide to purchase your  food.  Make a grocery list and stick to it. Cooking  Try to cook your favorite foods in a healthier way. For example, try baking instead of frying.  Use low-fat dairy products. Meal planning  Use more fruits and vegetables. Half of your plate should be fruits and vegetables.  Include lean proteins like poultry and fish. How do I count calories when eating out?  Ask for smaller portion sizes.  Consider sharing an entree and sides instead of getting your own entree.  If you get your own entree, eat only half. Ask for a box at the beginning of your meal and put the rest of your entree in it so you are not tempted to eat it.  If calories are listed on the menu, choose the lower calorie options.  Choose dishes that include vegetables, fruits, whole grains, low-fat dairy products, and lean protein.  Choose items that are boiled, broiled, grilled, or steamed. Stay away from items that are buttered, battered, fried, or served with cream sauce. Items labeled "crispy" are usually fried, unless stated otherwise.  Choose water, low-fat milk, unsweetened iced tea, or other drinks without added sugar. If you want an alcoholic beverage, choose a lower calorie option such as a glass of wine or light beer.  Ask for dressings, sauces, and syrups on the side. These are usually high in calories, so you should limit the amount you eat.  If you want a salad, choose a garden salad and ask for grilled meats. Avoid extra toppings like bacon, cheese, or fried items. Ask for the dressing on the side, or ask for olive oil and vinegar or lemon to use as dressing.  Estimate how many servings of a food you are given. For example, a serving of cooked rice is  cup or about the size of half a baseball. Knowing serving sizes will help you be aware of how much food you are eating at restaurants. The list below tells you how big or small some common portion sizes are based on everyday objects: ? 1 oz-4 stacked  dice. ? 3 oz-1 deck of cards. ?  1 tsp-1 die. ? 1 Tbsp- a ping-pong ball. ? 2 Tbsp-1 ping-pong ball. ?  cup- baseball. ? 1 cup-1 baseball. Summary  Calorie counting means keeping track of how many calories you eat and drink each day. If you eat fewer calories than your body needs, you should lose weight.  A healthy amount of weight to lose per week is usually 1-2 lb (0.5-0.9 kg). This usually means reducing your daily calorie intake by 500-750 calories.  The number of calories in a food can be found on a Nutrition Facts label. If a food does not have a Nutrition Facts label, try to look up the calories online or ask your dietitian for help.  Use your calories on foods and drinks that will fill you up, and not on foods and drinks that will leave you hungry.  Use smaller plates, glasses, and bowls to prevent overeating. This information is not intended to replace advice given to you by your health care provider. Make sure you discuss any questions you have with your health care provider. Document Released: 05/17/2005 Document Revised: 04/16/2016 Document Reviewed: 04/16/2016 Elsevier Interactive Patient Education  [employer].

## 2017-09-11 LAB — URINE CULTURE: Organism ID, Bacteria: NO GROWTH

## 2017-09-15 ENCOUNTER — Emergency Department (HOSPITAL_COMMUNITY)
Admission: EM | Admit: 2017-09-15 | Discharge: 2017-09-15 | Disposition: A | Discharge status: Home / Self Care | Payer: BC Managed Care – PPO | Attending: Emergency Medicine | Admitting: Emergency Medicine

## 2017-09-15 ENCOUNTER — Encounter (HOSPITAL_COMMUNITY): Payer: Self-pay | Admitting: Emergency Medicine

## 2017-09-15 ENCOUNTER — Other Ambulatory Visit: Payer: Self-pay

## 2017-09-15 DIAGNOSIS — Z5321 Procedure and treatment not carried out due to patient leaving prior to being seen by health care provider: Secondary | ICD-10-CM | POA: Diagnosis not present

## 2017-09-15 DIAGNOSIS — Z041 Encounter for examination and observation following transport accident: Secondary | ICD-10-CM | POA: Insufficient documentation

## 2017-09-15 NOTE — ED Notes (Signed)
Called Pt in lobby for vital recheck, no response in lobby x1. 
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
## 2017-09-16 ENCOUNTER — Ambulatory Visit: Payer: BC Managed Care – PPO | Admitting: Physician Assistant

## 2017-09-16 ENCOUNTER — Other Ambulatory Visit: Payer: Self-pay | Admitting: Physician Assistant

## 2017-09-16 ENCOUNTER — Encounter: Payer: Self-pay | Admitting: Physician Assistant

## 2017-09-16 VITALS — BP 118/66 | HR 66 | Temp 98.1°F | Resp 16 | Ht 68.0 in | Wt 210.8 lb

## 2017-09-16 DIAGNOSIS — F4323 Adjustment disorder with mixed anxiety and depressed mood: Secondary | ICD-10-CM

## 2017-09-16 DIAGNOSIS — M542 Cervicalgia: Secondary | ICD-10-CM | POA: Diagnosis not present

## 2017-09-16 MED ORDER — CYCLOBENZAPRINE HCL 10 MG PO TABS: 10.0000 mg | ORAL_TABLET | Freq: Every evening | ORAL | 0 refills | Status: DC | PRN
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
## 2017-09-16 NOTE — Patient Instructions (Addendum)
Continue the Aleve, with food, twice a day. Use the muscle relaxer, cyclobenzaprine (Flexeril) at bedtime, as needed. Apply ice to the area today, and then heat, starting tomorrow.    IF you received an x-ray today, you will receive an invoice from [facility]. Please contact [facility] at [phone] with questions or concerns regarding your invoice.   IF you received labwork today, you will receive an invoice from [facility]. Please contact [facility] at [phone] with questions or concerns regarding your invoice.   Our billing staff will not be able to assist you with questions regarding bills from these companies.  You will be contacted with the lab results as soon as they are available. The fastest way to get your results is to activate your My Chart account. Instructions are located on the last page of this paperwork. If you have not heard from us regarding the results in 2 weeks, please contact this office.
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
## 2017-09-16 NOTE — ED Notes (Signed)
Pt no longer in lobby. Called x3 

## 2017-09-21 ENCOUNTER — Encounter: Payer: Self-pay | Admitting: Family Medicine

## 2017-09-21 ENCOUNTER — Ambulatory Visit: Payer: BC Managed Care – PPO | Admitting: Family Medicine

## 2017-09-21 ENCOUNTER — Other Ambulatory Visit: Payer: Self-pay

## 2017-09-21 VITALS — BP 110/76 | HR 82 | Temp 98.3°F | Resp 17 | Ht 68.0 in | Wt 212.4 lb

## 2017-09-21 DIAGNOSIS — M549 Dorsalgia, unspecified: Secondary | ICD-10-CM

## 2017-09-21 DIAGNOSIS — M545 Low back pain, unspecified: Secondary | ICD-10-CM

## 2017-09-21 DIAGNOSIS — M542 Cervicalgia: Secondary | ICD-10-CM | POA: Diagnosis not present

## 2017-09-21 LAB — POCT URINALYSIS DIP (MANUAL ENTRY)
Bilirubin, UA: NEGATIVE
Blood, UA: NEGATIVE
Glucose, UA: NEGATIVE mg/dL
Leukocytes, UA: NEGATIVE
Nitrite, UA: NEGATIVE
Protein Ur, POC: NEGATIVE mg/dL
SPEC GRAV UA: 1.02 (ref 1.010–1.025)
Urobilinogen, UA: 4 E.U./dL — AB
pH, UA: 7 (ref 5.0–8.0)

## 2017-09-21 MED ORDER — IBUPROFEN 800 MG PO TABS: 800.0000 mg | ORAL_TABLET | Freq: Three times a day (TID) | ORAL | 0 refills | Status: DC | PRN

## 2017-09-21 NOTE — Patient Instructions (Addendum)
IF you received an x-ray today, you will receive an invoice from [facility]. Please contact [facility] at [phone] with questions or concerns regarding your invoice.   IF you received labwork today, you will receive an invoice from [facility]. Please contact [facility] at [phone] with questions or concerns regarding your invoice.   Our billing staff will not be able to assist you with questions regarding bills from these companies.  You will be contacted with the lab results as soon as they are available. The fastest way to get your results is to activate your My Chart account. Instructions are located on the last page of this paperwork. If you have not heard from us regarding the results in 2 weeks, please contact this office.     Back Pain, Adult Many adults have back pain from time to time. Common causes of back pain include:  A strained muscle or ligament.  Wear and tear (degeneration) of the spinal disks.  Arthritis.  A hit to the back.  Back pain can be short-lived (acute) or last a long time (chronic). A physical exam, lab tests, and imaging studies may be done to find the cause of your pain. Follow these instructions at home: Managing pain and stiffness  Take over-the-counter and prescription medicines only as told by your health care provider.  If directed, apply heat to the affected area as often as told by your health care provider. Use the heat source that your health care provider recommends, such as a moist heat pack or a heating pad. ? Place a towel between your skin and the heat source. ? Leave the heat on for 20-30 minutes. ? Remove the heat if your skin turns bright red. This is especially important if you are unable to feel pain, heat, or cold. You have a greater risk of getting burned.  If directed, apply ice to the injured area: ? Put ice in a plastic bag. ? Place a towel between your skin and the bag. ? Leave the ice on for 20  minutes, 2-3 times a day for the first 2-3 days. Activity  Do not stay in bed. Resting more than 1-2 days can delay your recovery.  Take short walks on even surfaces as soon as you are able. Try to increase the length of time you walk each day.  Do not sit, drive, or stand in one place for more than 30 minutes at a time. Sitting or standing for long periods of time can put stress on your back.  Use proper lifting techniques. When you bend and lift, use positions that put less stress on your back: ? [place] Bend[place]end your knees. ? Keep the load close to your body. ? Avoid twisting.  Exercise regularly as told by your health care provider. Exercising will help your back heal faster. This also helps prevent back injuries by keeping muscles strong and flexible.  Your health care provider may recommend that you see a physical therapist. This person can help you come up with a safe exercise program. Do any exercises as told by your physical therapist. Lifestyle  Maintain a healthy weight. Extra weight puts stress on your back and makes it difficult to have good posture.  Avoid activities or situations that make you feel anxious or stressed. Learn ways to manage anxiety and stress. One way to manage stress is through exercise. Stress and anxiety increase muscle tension and can make back pain worse. General instructions  Sleep on a firm mattress  in a comfortable position. Try lying on your side with your knees slightly bent. If you lie on your back, put a pillow under your knees.  Follow your treatment plan as told by your health care provider. This may include: ? Cognitive or behavioral therapy. ? Acupuncture or massage therapy. ? Meditation or yoga. Contact a health care provider if:  You have pain that is not relieved with rest or medicine.  You have increasing pain going down into your legs or buttocks.  Your pain does not improve in 2 weeks.  You have pain at night.  You lose weight.  You  have a fever or chills. Get help right away if:  You develop new bowel or bladder control problems.  You have unusual weakness or numbness in your arms or legs.  You develop nausea or vomiting.  You develop abdominal pain.  You feel faint. Summary  Many adults have back pain from time to time. A physical exam, lab tests, and imaging studies may be done to find the cause of your pain.  Use proper lifting techniques. When you bend and lift, use positions that put less stress on your back.  Take over-the-counter and prescription medicines and apply heat or ice as directed by your health care provider. This information is not intended to replace advice given to you by your health care provider. Make sure you discuss any questions you have with your health care provider. Document Released: 05/17/2005 Document Revised: 06/21/2016 Document Reviewed: 06/21/2016 Elsevier Interactive Patient Education  [employer]2018 Elsevier Inc.
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
## 2017-10-25 ENCOUNTER — Telehealth: Payer: Self-pay | Admitting: Family Medicine

## 2017-10-25 NOTE — Telephone Encounter (Signed)
Copied from CRM [id number]. Topic: Quick Communication - Rx Refill/Question >> Oct 25, 2017  2:44 PM Jolayne Haines L wrote: Medication: pantoprazole (PROTONIX) 40 MG tablet  Has the patient contacted their pharmacy? yes (Agent: If no, request that the patient contact the pharmacy for the refill.) (Agent: If yes, when and what did the pharmacy advise?) Contacted them on 5/26 to [place]  ( no longer at the practice )  Preferred Pharmacy (with phone number or street name): [id number] - [place], [place] - [address]  Agent: Please be advised that RX refills may take up to 3 business days. We ask that you follow-up with your pharmacy.

## 2017-10-26 ENCOUNTER — Other Ambulatory Visit: Payer: Self-pay

## 2017-10-26 DIAGNOSIS — K219 Gastro-esophageal reflux disease without esophagitis: Secondary | ICD-10-CM

## 2017-10-26 NOTE — Telephone Encounter (Signed)
LOV  07/02/17 Ms. English not at practice - need a provider to refill.

## 2017-10-27 ENCOUNTER — Other Ambulatory Visit: Payer: Self-pay | Admitting: Family Medicine

## 2017-10-27 DIAGNOSIS — K219 Gastro-esophageal reflux disease without esophagitis: Secondary | ICD-10-CM

## 2017-10-27 NOTE — Telephone Encounter (Signed)
Pt advised that she needs an office visit for this.

## 2017-10-27 NOTE — Telephone Encounter (Signed)
Patient calling back to check on status of medication refill since English is not in the office. Patient would like a call back from [place] [facility].

## 2017-10-28 ENCOUNTER — Other Ambulatory Visit: Payer: Self-pay | Admitting: Physician Assistant

## 2017-10-28 DIAGNOSIS — M542 Cervicalgia: Secondary | ICD-10-CM

## 2017-10-28 NOTE — Telephone Encounter (Signed)
Refill of flexeril  LOV 09/16/17 C.Tinnie Gens  [facility] 09/16/17  #20  0 refills  [id number] - [place], [place] - [address]

## 2017-10-31 ENCOUNTER — Ambulatory Visit (INDEPENDENT_AMBULATORY_CARE_PROVIDER_SITE_OTHER): Payer: BC Managed Care – PPO | Admitting: Family Medicine

## 2017-10-31 ENCOUNTER — Encounter: Payer: Self-pay | Admitting: Family Medicine

## 2017-10-31 ENCOUNTER — Other Ambulatory Visit: Payer: Self-pay

## 2017-10-31 VITALS — BP 110/76 | HR 83 | Temp 98.6°F | Resp 16 | Ht 68.0 in | Wt 211.6 lb

## 2017-10-31 DIAGNOSIS — Z760 Encounter for issue of repeat prescription: Secondary | ICD-10-CM | POA: Diagnosis not present

## 2017-10-31 DIAGNOSIS — R635 Abnormal weight gain: Secondary | ICD-10-CM | POA: Diagnosis not present

## 2017-10-31 DIAGNOSIS — F4323 Adjustment disorder with mixed anxiety and depressed mood: Secondary | ICD-10-CM

## 2017-10-31 DIAGNOSIS — K219 Gastro-esophageal reflux disease without esophagitis: Secondary | ICD-10-CM

## 2017-10-31 DIAGNOSIS — Z9884 Bariatric surgery status: Secondary | ICD-10-CM | POA: Diagnosis not present

## 2017-10-31 MED ORDER — ESCITALOPRAM OXALATE 10 MG PO TABS: ORAL_TABLET | ORAL | 3 refills | Status: DC

## 2017-10-31 MED ORDER — PANTOPRAZOLE SODIUM 40 MG PO TBEC: DELAYED_RELEASE_TABLET | ORAL | 3 refills | Status: DC

## 2017-10-31 MED ORDER — METFORMIN HCL 500 MG PO TABS: 500.0000 mg | ORAL_TABLET | Freq: Every day | ORAL | 0 refills | Status: DC

## 2017-10-31 MED ORDER — RANITIDINE HCL 150 MG PO TABS: 150.0000 mg | ORAL_TABLET | Freq: Two times a day (BID) | ORAL | 3 refills | Status: AC

## 2017-10-31 NOTE — Patient Instructions (Addendum)
   IF you received an x-ray today, you will receive an invoice from [facility]. Please contact [facility] at [phone] with questions or concerns regarding your invoice.   IF you received labwork today, you will receive an invoice from [facility]. Please contact [facility] at [phone] with questions or concerns regarding your invoice.   Our billing staff will not be able to assist you with questions regarding bills from these companies.  You will be contacted with the lab results as soon as they are available. The fastest way to get your results is to activate your My Chart account. Instructions are located on the last page of this paperwork. If you have not heard from us regarding the results in 2 weeks, please contact this office.     Gastroesophageal Reflux Disease, Adult Normally, food travels down the esophagus and stays in the stomach to be digested. However, when a person has gastroesophageal reflux disease (GERD), food and stomach acid move back up into the esophagus. When this happens, the esophagus becomes sore and inflamed. Over time, GERD can create small holes (ulcers) in the lining of the esophagus. What are the causes? This condition is caused by a problem with the muscle between the esophagus and the stomach (lower esophageal sphincter, or LES). Normally, the LES muscle closes after food passes through the esophagus to the stomach. When the LES is weakened or abnormal, it does not close properly, and that allows food and stomach acid to go back up into the esophagus. The LES can be weakened by certain dietary substances, medicines, and medical conditions, including:  Tobacco use.  Pregnancy.  Having a hiatal hernia.  Heavy alcohol use.  Certain foods and beverages, such as coffee, chocolate, onions, and peppermint.  What increases the risk? This condition is more likely to develop in:  People who have an increased body weight.  People who have  connective tissue disorders.  People who use NSAID medicines.  What are the signs or symptoms? Symptoms of this condition include:  Heartburn.  Difficult or painful swallowing.  The feeling of having a lump in the throat.  Abitter taste in the mouth.  Bad breath.  Having a large amount of saliva.  Having an upset or bloated stomach.  Belching.  Chest pain.  Shortness of breath or wheezing.  Ongoing (chronic) cough or a night-time cough.  Wearing away of tooth enamel.  Weight loss.  Different conditions can cause chest pain. Make sure to see your health care provider if you experience chest pain. How is this diagnosed? Your health care provider will take a medical history and perform a physical exam. To determine if you have mild or severe GERD, your health care provider may also monitor how you respond to treatment. You may also have other tests, including:  An endoscopy toexamine your stomach and esophagus with a small camera.  A test thatmeasures the acidity level in your esophagus.  A test thatmeasures how much pressure is on your esophagus.  A barium swallow or modified barium swallow to show the shape, size, and functioning of your esophagus.  How is this treated? The goal of treatment is to help relieve your symptoms and to prevent complications. Treatment for this condition may vary depending on how severe your symptoms are. Your health care provider may recommend:  Changes to your diet.  Medicine.  Surgery.  Follow these instructions at home: Diet  Follow a diet as recommended by your health care provider. This may involve   avoiding foods and drinks such as: ? Coffee and tea (with or without caffeine). ? Drinks that containalcohol. ? Energy drinks and sports drinks. ? Carbonated drinks or sodas. ? Chocolate and cocoa. ? Peppermint and mint flavorings. ? Garlic and onions. ? Horseradish. ? Spicy and acidic foods, including peppers, chili  powder, curry powder, vinegar, hot sauces, and barbecue sauce. ? Citrus fruit juices and citrus fruits, such as oranges, lemons, and limes. ? Tomato-based foods, such as red sauce, chili, salsa, and pizza with red sauce. ? Fried and fatty foods, such as donuts, french fries, potato chips, and high-fat dressings. ? High-fat meats, such as hot dogs and fatty cuts of red and white meats, such as rib eye steak, sausage, ham, and bacon. ? High-fat dairy items, such as whole milk, butter, and cream cheese.  Eat small, frequent meals instead of large meals.  Avoid drinking large amounts of liquid with your meals.  Avoid eating meals during the 2-3 hours before bedtime.  Avoid lying down right after you eat.  Do not exercise right after you eat. General instructions  Pay attention to any changes in your symptoms.  Take over-the-counter and prescription medicines only as told by your health care provider. Do not take aspirin, ibuprofen, or other NSAIDs unless your health care provider told you to do so.  Do not use any tobacco products, including cigarettes, chewing tobacco, and e-cigarettes. If you need help quitting, ask your health care provider.  Wear loose-fitting clothing. Do not wear anything tight around your waist that causes pressure on your abdomen.  Raise (elevate) the head of your bed 6 inches (15cm).  Try to reduce your stress, such as with yoga or meditation. If you need help reducing stress, ask your health care provider.  If you are overweight, reduce your weight to an amount that is healthy for you. Ask your health care provider for guidance about a safe weight loss goal.  Keep all follow-up visits as told by your health care provider. This is important. Contact a health care provider if:  You have new symptoms.  You have unexplained weight loss.  You have difficulty swallowing, or it hurts to swallow.  You have wheezing or a persistent cough.  Your symptoms do not  improve with treatment.  You have a hoarse voice. Get help right away if:  You have pain in your arms, neck, jaw, teeth, or back.  You feel sweaty, dizzy, or light-headed.  You have chest pain or shortness of breath.  You vomit and your vomit looks like blood or coffee grounds.  You faint.  Your stool is bloody or black.  You cannot swallow, drink, or eat. This information is not intended to replace advice given to you by your health care provider. Make sure you discuss any questions you have with your health care provider. Document Released: 02/24/2005 Document Revised: 10/15/2015 Document Reviewed: 09/11/2014 Elsevier Interactive Patient Education  2018 Elsevier Inc.  
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
## 2017-11-04 NOTE — Telephone Encounter (Signed)
Patient requesting refill, please advise

## 2017-11-05 NOTE — Telephone Encounter (Signed)
Meds ordered this encounter  Medications  . cyclobenzaprine (FLEXERIL) 10 MG tablet    Sig: TAKE 1 TABLET BY MOUTH AT BEDTIME AS NEEDED FOR MUSCLE SPASMS.    Dispense:  20 tablet    Refill:  0

## 2017-11-23 NOTE — Progress Notes (Signed)
Chief Complaint  Patient presents with  . diabetes and weight    HPI   Pt is s/p bariatric surgery and is regaining weight She Is eating well, walks daily and tries to get sleep Does not drink empty calories She states that the weight is creeping back up  She gets her fiber and has normal BM Wt Readings from Last 3 Encounters:  01/03/18 216 lb 12.8 oz (98.3 kg)  11/30/17 217 lb 9.6 oz (98.7 kg)  10/31/17 211 lb 9.6 oz (96 kg)   Lab Results  Component Value Date   HGBA1C 5.2 11/30/2017      No past medical history on file.  Current Outpatient Medications  Medication Sig Dispense Refill  . escitalopram (LEXAPRO) 10 MG tablet TAKE 1 TABLET BY MOUTH EVERY DAY 90 tablet 3  . pantoprazole (PROTONIX) 40 MG tablet Take 1 tablet by mouth daily 90 tablet 3  . ranitidine (ZANTAC) 150 MG tablet Take 1 tablet (150 mg total) by mouth 2 (two) times daily. 180 tablet 3  . cyclobenzaprine (FLEXERIL) 10 MG tablet TAKE 1 TABLET BY MOUTH AT BEDTIME AS NEEDED FOR MUSCLE SPASMS. (Patient not taking: Reported on 11/30/2017) 20 tablet 0  . phentermine 15 MG capsule Take 1 capsule (15 mg total) by mouth every morning. 30 capsule 0   No current facility-administered medications for this visit.     Allergies: No Known Allergies  Past Surgical History:  Procedure Laterality Date  . CESAREAN SECTION    . LAPAROSCOPIC GASTRIC SLEEVE RESECTION      Social History   Socioeconomic History  . Marital status: Single    Spouse name: Not on file  . Number of children: Not on file  . Years of education: Not on file  . Highest education level: Not on file  Occupational History  . Not on file  Social Needs  . Financial resource strain: Not on file  . Food insecurity:    Worry: Not on file    Inability: Not on file  . Transportation needs:    Medical: Not on file    Non-medical: Not on file  Tobacco Use  . Smoking status: Never Smoker  . Smokeless tobacco: Never Used  Substance and Sexual  Activity  . Alcohol use: Yes    Alcohol/week: 0.0 standard drinks    Comment: socially  . Drug use: No  . Sexual activity: Never  Lifestyle  . Physical activity:    Days per week: Not on file    Minutes per session: Not on file  . Stress: Not on file  Relationships  . Social connections:    Talks on phone: Not on file    Gets together: Not on file    Attends religious service: Not on file    Active member of club or organization: Not on file    Attends meetings of clubs or organizations: Not on file    Relationship status: Not on file  Other Topics Concern  . Not on file  Social History Narrative  . Not on file    Family History  Problem Relation Age of Onset  . Diabetes Mother   . Hyperlipidemia Mother   . Hypertension Mother   . Hyperlipidemia Sister   . Diabetes Maternal Grandfather   . Heart disease Paternal Grandmother      ROS Review of Systems See HPI Constitution: No fevers or chills No malaise No diaphoresis Skin: No rash or itching Eyes: no blurry vision, no double vision  GU: no dysuria or hematuria Neuro: no dizziness or headaches  all others reviewed and negative   Objective: Vitals:   11/30/17 1536  BP: 114/77  Pulse: 78  Resp: 16  Temp: 97.6 F (36.4 C)  TempSrc: Oral  SpO2: 100%  Weight: 217 lb 9.6 oz (98.7 kg)  Height: 5\' 8"  (1.727 m)    Physical Exam  Constitutional: She appears well-developed and well-nourished.  HENT:  Head: Normocephalic and atraumatic.  Cardiovascular: Normal rate, regular rhythm and normal heart sounds.  Pulmonary/Chest: Effort normal and breath sounds normal. No stridor. No respiratory distress. She has no wheezes.    Assessment and Plan Alli was seen today for diabetes and weight.  Diagnoses and all orders for this visit:  S/P bariatric surgery -     Cancel: POCT glycosylated hemoglobin (Hb A1C) -     POCT urinalysis dipstick -     Cancel: Lipid panel  Weight gain -     POCT glycosylated  hemoglobin (Hb A1C)  Other orders -     Lipid panel   Will screen for contributing factors Will assess for endocrine issues Will consider metformin to aid weight loss  Ember Henrikson A Creta LevinStallings

## 2017-11-30 ENCOUNTER — Encounter: Payer: Self-pay | Admitting: Family Medicine

## 2017-11-30 ENCOUNTER — Other Ambulatory Visit: Payer: Self-pay

## 2017-11-30 ENCOUNTER — Ambulatory Visit: Payer: BC Managed Care – PPO | Admitting: Family Medicine

## 2017-11-30 VITALS — BP 114/77 | HR 78 | Temp 97.6°F | Resp 16 | Ht 68.0 in | Wt 217.6 lb

## 2017-11-30 DIAGNOSIS — Z9884 Bariatric surgery status: Secondary | ICD-10-CM

## 2017-11-30 DIAGNOSIS — Z13220 Encounter for screening for lipoid disorders: Secondary | ICD-10-CM

## 2017-11-30 DIAGNOSIS — R635 Abnormal weight gain: Secondary | ICD-10-CM | POA: Diagnosis not present

## 2017-11-30 LAB — POCT GLYCOSYLATED HEMOGLOBIN (HGB A1C): HEMOGLOBIN A1C: 5.2 % (ref 4.0–5.6)

## 2017-11-30 LAB — POCT URINALYSIS DIP (MANUAL ENTRY)
Bilirubin, UA: NEGATIVE
Blood, UA: NEGATIVE
GLUCOSE UA: NEGATIVE mg/dL
Ketones, POC UA: NEGATIVE mg/dL
LEUKOCYTES UA: NEGATIVE
NITRITE UA: NEGATIVE
PROTEIN UA: NEGATIVE mg/dL
Spec Grav, UA: 1.015 (ref 1.010–1.025)
UROBILINOGEN UA: 0.2 U/dL
pH, UA: 6 (ref 5.0–8.0)

## 2017-11-30 NOTE — Patient Instructions (Addendum)
Intermittent Fasting Can help with weight loss   IF you received an x-ray today, you will receive an invoice [facility]. Please contact [facility] at [phone] with questions or concerns regarding your invoice.   IF you received labwork today, you will receive an invoice from [facility]. Please contact [facility] at [phone] with questions or concerns regarding your invoice.   Our billing staff will not be able to assist you with questions regarding bills from these companies.  You will be contacted with the lab results as soon as they are available. The fastest way to get your results is to activate your My Chart account. Instructions are located on the last page of this paperwork. If you have not heard from us regarding the results in 2 weeks, please contact this office.     From [email or web address] . Last Reviewed: January 22, 2016  . Last Edited: March 15, 2016   Protein Foods Foods high in protein such as fish, chicken, meats, soy products, and cheese, are all called "protein foods." You may also hear them referred to as 'meats or meat substitutes." The biggest difference among foods in this group is how much fat they contain, and for the vegetarian proteins, whether they have carbohydrate.  Protein Choices Plant-Based Proteins Plant-based protein foods provide quality protein, healthy fats, and fiber. They vary in how much fat and carbohydrate they contain, so make sure to read labels. . Beans such as black, kidney, and pinto  . Bean products like baked beans and refried beans  . Hummus and falafel  . Lentils such as brown, green, or yellow  . Peas such as black-eyed or split peas  . Edamame  . Soy nuts  . Nuts and spreads like almond butter, cashew butter, or peanut butter  . Tempeh, tofu   Fish and Seafood Try to include fish at least 2 times per week. . Fish high in omega-3 fatty acids like [place] tuna, herring, mackerel, rainbow trout, sardines,  and salmon  . Other fish including catfish, cod, flounder, haddock, halibut, orange roughy, and tilapia  . Shellfish including clams, crab, imitation shellfish, lobster, scallops, shrimp, oysters.  Medical illustratoroultry Choose poultry without the skin for less saturated fat and cholesterol. . Chicken, Malaw[place]urkey, cornish hen  Cheese and Eggs . Reduced-fat cheese or regular cheese in small amounts  . Cottage cheese  . Whole eggs  Game . Buffalo, ostrich, rabbit, venison  . Dove, duck, goose, or pheasant (no skin)  Beef, Pork, Veal, Lamb It's best to limit your intake of red meat which is often higher in saturated fat and processed meats like ham, bacon and hot dogs which are often higher in saturated fat and sodium. If you decide to have these, choose the leanest options, which are: . Select or Choice grades of beef trimmed of fat including: chuck, rib, rump roast, round, sirloin, cubed, flank, porterhouse, T-bone steak, tenderloin  . Lamb: chop, leg, or roast  . Veal: loin chop or roast  . Pork: Canadian bacon, center loin chop, ham, tenderloin

## 2017-12-01 LAB — LIPID PANEL
Chol/HDL Ratio: 2.1 ratio (ref 0.0–4.4)
Cholesterol, Total: 202 mg/dL — ABNORMAL HIGH (ref 100–199)
HDL: 96 mg/dL (ref >39)
LDL CALC: 96 mg/dL (ref 0–99)
Triglycerides: 51 mg/dL (ref 0–149)
VLDL CHOLESTEROL CAL: 10 mg/dL (ref 5–40)

## 2018-01-02 ENCOUNTER — Ambulatory Visit: Payer: BC Managed Care – PPO | Admitting: Family Medicine

## 2018-01-02 NOTE — Progress Notes (Signed)
Chief Complaint  Patient presents with  . Weight Check    one f/u    HPI  Phentermine refill Pt was on phentermine for obesity prescribed by another doctor She denies heart palpitations or UTIs  Wt Readings from Last 3 Encounters:  01/03/18 216 lb 12.8 oz (98.3 kg)  11/30/17 217 lb 9.6 oz (98.7 kg)  10/31/17 211 lb 9.6 oz (96 kg)      No past medical history on file.  Current Outpatient Medications  Medication Sig Dispense Refill  . escitalopram (LEXAPRO) 10 MG tablet TAKE 1 TABLET BY MOUTH EVERY DAY 90 tablet 3  . pantoprazole (PROTONIX) 40 MG tablet Take 1 tablet by mouth daily 90 tablet 3  . ranitidine (ZANTAC) 150 MG tablet Take 1 tablet (150 mg total) by mouth 2 (two) times daily. 180 tablet 3  . cyclobenzaprine (FLEXERIL) 10 MG tablet TAKE 1 TABLET BY MOUTH AT BEDTIME AS NEEDED FOR MUSCLE SPASMS. (Patient not taking: Reported on 11/30/2017) 20 tablet 0  . phentermine 15 MG capsule Take 1 capsule (15 mg total) by mouth every morning. 30 capsule 0   No current facility-administered medications for this visit.     Allergies: No Known Allergies  Past Surgical History:  Procedure Laterality Date  . CESAREAN SECTION    . LAPAROSCOPIC GASTRIC SLEEVE RESECTION      Social History   Socioeconomic History  . Marital status: Single    Spouse name: Not on file  . Number of children: Not on file  . Years of education: Not on file  . Highest education level: Not on file  Occupational History  . Not on file  Social Needs  . Financial resource strain: Not on file  . Food insecurity:    Worry: Not on file    Inability: Not on file  . Transportation needs:    Medical: Not on file    Non-medical: Not on file  Tobacco Use  . Smoking status: Never Smoker  . Smokeless tobacco: Never Used  Substance and Sexual Activity  . Alcohol use: Yes    Alcohol/week: 0.0 standard drinks    Comment: socially  . Drug use: No  . Sexual activity: Never  Lifestyle  . Physical  activity:    Days per week: Not on file    Minutes per session: Not on file  . Stress: Not on file  Relationships  . Social connections:    Talks on phone: Not on file    Gets together: Not on file    Attends religious service: Not on file    Active member of club or organization: Not on file    Attends meetings of clubs or organizations: Not on file    Relationship status: Not on file  Other Topics Concern  . Not on file  Social History Narrative  . Not on file    Family History  Problem Relation Age of Onset  . Diabetes Mother   . Hyperlipidemia Mother   . Hypertension Mother   . Hyperlipidemia Sister   . Diabetes Maternal Grandfather   . Heart disease Paternal Grandmother      ROS Review of Systems See HPI Constitution: No fevers or chills No malaise No diaphoresis Skin: No rash or itching Eyes: no blurry vision, no double vision GU: no dysuria or hematuria Neuro: no dizziness or headaches all others reviewed and negative   Objective: Vitals:   01/03/18 0817  BP: 132/74  Pulse: 62  Resp: 17  Temp:  99.1 F (37.3 C)  TempSrc: Oral  SpO2: 100%  Weight: 216 lb 12.8 oz (98.3 kg)  Height: 5\' 8"  (1.727 m)  Body mass index is 32.96 kg/m.   Physical Exam Physical Exam  Constitutional: She is oriented to person, place, and time. She appears well-developed and well-nourished.  HENT:  Head: Normocephalic and atraumatic.  Eyes: Conjunctivae and EOM are normal.  Cardiovascular: Normal rate, regular rhythm and normal heart sounds.   Pulmonary/Chest: Effort normal and breath sounds normal. No respiratory distress. She has no wheezes.    Assessment and Plan Julieann was seen today for weight check.  Diagnoses and all orders for this visit:  Class 1 obesity due to excess calories without serious comorbidity with body mass index (BMI) of 32.0 to 32.9 in adult  Weight loss counseling, encounter for  Other orders -     phentermine 15 MG capsule; Take 1  capsule (15 mg total) by mouth every morning.   Will do a refill with close monitoring   Jillian [name] A Creta Levin

## 2018-01-03 ENCOUNTER — Encounter: Payer: Self-pay | Admitting: Family Medicine

## 2018-01-03 ENCOUNTER — Ambulatory Visit (INDEPENDENT_AMBULATORY_CARE_PROVIDER_SITE_OTHER): Payer: BC Managed Care – PPO | Admitting: Family Medicine

## 2018-01-03 ENCOUNTER — Other Ambulatory Visit: Payer: Self-pay

## 2018-01-03 VITALS — BP 132/74 | HR 62 | Temp 99.1°F | Resp 17 | Ht 68.0 in | Wt 216.8 lb

## 2018-01-03 DIAGNOSIS — E6609 Other obesity due to excess calories: Secondary | ICD-10-CM | POA: Diagnosis not present

## 2018-01-03 DIAGNOSIS — Z6832 Body mass index (BMI) 32.0-32.9, adult: Secondary | ICD-10-CM

## 2018-01-03 DIAGNOSIS — Z713 Dietary counseling and surveillance: Secondary | ICD-10-CM | POA: Diagnosis not present

## 2018-01-03 MED ORDER — PHENTERMINE HCL 15 MG PO CAPS: 15.0000 mg | ORAL_CAPSULE | ORAL | 0 refills | Status: AC

## 2018-01-03 NOTE — Patient Instructions (Signed)
     IF you received an x-ray today, you will receive an invoice from [facility]. Please contact [facility] at [phone] with questions or concerns regarding your invoice.   IF you received labwork today, you will receive an invoice from [facility]. Please contact [facility] at [phone] with questions or concerns regarding your invoice.   Our billing staff will not be able to assist you with questions regarding bills from these companies.  You will be contacted with the lab results as soon as they are available. The fastest way to get your results is to activate your My Chart account. Instructions are located on the last page of this paperwork. If you have not heard from us regarding the results in 2 weeks, please contact this office.     

## 2018-01-12 ENCOUNTER — Telehealth: Payer: Self-pay | Admitting: Family Medicine

## 2018-01-12 NOTE — Telephone Encounter (Signed)
Left a voicemail in regards to her appt she has with Dr. Stallings on 02/06/2018. The provider is leaving early and needs to be rescheduled. °

## 2018-01-31 DIAGNOSIS — Z9884 Bariatric surgery status: Secondary | ICD-10-CM | POA: Insufficient documentation

## 2018-02-06 ENCOUNTER — Other Ambulatory Visit: Payer: Self-pay | Admitting: Family Medicine

## 2018-02-06 ENCOUNTER — Telehealth: Payer: Self-pay | Admitting: Family Medicine

## 2018-02-06 ENCOUNTER — Ambulatory Visit: Payer: BC Managed Care – PPO | Admitting: Family Medicine

## 2018-02-06 DIAGNOSIS — F4323 Adjustment disorder with mixed anxiety and depressed mood: Secondary | ICD-10-CM

## 2018-02-06 NOTE — Telephone Encounter (Signed)
Copied from CRM [id number]. Topic: Quick Communication - See Telephone Encounter >> Feb 06, 2018  9:47 AM Tamela Oddi, NT wrote: CRM for notification. See Telephone encounter for: 02/06/18. Patient called and states that she has some concerns about a pregnancy test she took. She wants to get an opinion on it. Please call CB# [phone]

## 2018-02-06 NOTE — Telephone Encounter (Signed)
escitalopram 10 mg  refill Last Refill:10/31/17 # 90 and 3 refills Last OV: 01/03/18 PCP: Laurence Aly  Pharmacy:[facility] [phone]  Refills good to next June 2020

## 2018-02-06 NOTE — Telephone Encounter (Signed)
Left message to call back on voice mail

## 2018-11-09 ENCOUNTER — Other Ambulatory Visit: Payer: Self-pay | Admitting: Family Medicine

## 2018-11-09 DIAGNOSIS — F4323 Adjustment disorder with mixed anxiety and depressed mood: Secondary | ICD-10-CM

## 2018-11-09 DIAGNOSIS — K219 Gastro-esophageal reflux disease without esophagitis: Secondary | ICD-10-CM

## 2018-11-09 NOTE — Telephone Encounter (Signed)
Forwarding medication refills to PCP for review.
